# Patient Record
Sex: Male | Born: 1960 | Race: Asian | Hispanic: No | Marital: Married | Smoking: Never smoker
Health system: Southern US, Community
[De-identification: ages and names within clinical notes are randomized; demographics above are authoritative.]

## PROBLEM LIST (undated history)

## (undated) DIAGNOSIS — R51 Headache: Secondary | ICD-10-CM

## (undated) DIAGNOSIS — G43909 Migraine, unspecified, not intractable, without status migrainosus: Secondary | ICD-10-CM

## (undated) DIAGNOSIS — F329 Major depressive disorder, single episode, unspecified: Secondary | ICD-10-CM

## (undated) DIAGNOSIS — Z9889 Other specified postprocedural states: Secondary | ICD-10-CM

## (undated) DIAGNOSIS — D171 Benign lipomatous neoplasm of skin and subcutaneous tissue of trunk: Secondary | ICD-10-CM

## (undated) DIAGNOSIS — K219 Gastro-esophageal reflux disease without esophagitis: Secondary | ICD-10-CM

## (undated) DIAGNOSIS — F419 Anxiety disorder, unspecified: Secondary | ICD-10-CM

## (undated) DIAGNOSIS — F40240 Claustrophobia: Secondary | ICD-10-CM

## (undated) DIAGNOSIS — R112 Nausea with vomiting, unspecified: Secondary | ICD-10-CM

## (undated) DIAGNOSIS — F32A Depression, unspecified: Secondary | ICD-10-CM

## (undated) HISTORY — PX: SHOULDER SURGERY: SHX246

## (undated) HISTORY — DX: Benign lipomatous neoplasm of skin and subcutaneous tissue of trunk: D17.1

## (undated) HISTORY — DX: Major depressive disorder, single episode, unspecified: F32.9

## (undated) HISTORY — DX: Depression, unspecified: F32.A

## (undated) HISTORY — DX: Migraine, unspecified, not intractable, without status migrainosus: G43.909

## (undated) HISTORY — PX: OTHER SURGICAL HISTORY: SHX169

## (undated) HISTORY — DX: Anxiety disorder, unspecified: F41.9

## (undated) HISTORY — DX: Claustrophobia: F40.240

---

## 1998-04-29 ENCOUNTER — Inpatient Hospital Stay (HOSPITAL_COMMUNITY): Admission: RE | Admit: 1998-04-29 | Discharge: 1998-05-02 | Payer: Self-pay | Admitting: *Deleted

## 2006-08-29 ENCOUNTER — Emergency Department (HOSPITAL_COMMUNITY): Admission: EM | Admit: 2006-08-29 | Discharge: 2006-08-30 | Payer: Self-pay | Admitting: Emergency Medicine

## 2008-01-11 ENCOUNTER — Other Ambulatory Visit (HOSPITAL_COMMUNITY): Admission: RE | Admit: 2008-01-11 | Discharge: 2008-04-10 | Payer: Self-pay | Admitting: Psychiatry

## 2010-03-07 ENCOUNTER — Encounter: Admission: RE | Admit: 2010-03-07 | Discharge: 2010-03-07 | Payer: Self-pay | Admitting: Orthopaedic Surgery

## 2010-04-17 ENCOUNTER — Ambulatory Visit (HOSPITAL_COMMUNITY): Admission: RE | Admit: 2010-04-17 | Discharge: 2010-04-17 | Payer: Self-pay | Admitting: Orthopaedic Surgery

## 2010-04-18 ENCOUNTER — Inpatient Hospital Stay (HOSPITAL_COMMUNITY): Admission: EM | Admit: 2010-04-18 | Discharge: 2010-04-19 | Payer: Self-pay | Admitting: Specialist

## 2011-01-25 LAB — CBC
Hemoglobin: 14.6 g/dL (ref 13.0–17.0)
Platelets: 188 10*3/uL (ref 150–400)
RBC: 4.91 MIL/uL (ref 4.22–5.81)
RDW: 12.4 % (ref 11.5–15.5)
WBC: 5.6 10*3/uL (ref 4.0–10.5)

## 2013-01-30 ENCOUNTER — Ambulatory Visit (INDEPENDENT_AMBULATORY_CARE_PROVIDER_SITE_OTHER): Payer: Federal, State, Local not specified - PPO | Admitting: Surgery

## 2013-01-30 ENCOUNTER — Encounter (INDEPENDENT_AMBULATORY_CARE_PROVIDER_SITE_OTHER): Payer: Self-pay

## 2013-01-30 ENCOUNTER — Encounter (INDEPENDENT_AMBULATORY_CARE_PROVIDER_SITE_OTHER): Payer: Self-pay | Admitting: Surgery

## 2013-01-30 VITALS — BP 120/78 | HR 110 | Temp 98.0°F | Ht 69.0 in | Wt 206.8 lb

## 2013-01-30 DIAGNOSIS — D1739 Benign lipomatous neoplasm of skin and subcutaneous tissue of other sites: Secondary | ICD-10-CM

## 2013-01-30 DIAGNOSIS — D171 Benign lipomatous neoplasm of skin and subcutaneous tissue of trunk: Secondary | ICD-10-CM

## 2013-01-30 DIAGNOSIS — F40240 Claustrophobia: Secondary | ICD-10-CM | POA: Insufficient documentation

## 2013-01-30 HISTORY — DX: Benign lipomatous neoplasm of skin and subcutaneous tissue of trunk: D17.1

## 2013-01-30 NOTE — Progress Notes (Signed)
Patient ID: Harold Sullivan, male   DOB: 03/05/1961, 52 y.o.   MRN: [id number] NAME: Harold Sullivan DOB: 04/15/1961 MRN: [id number]                                                                                      DATE: 01/30/2013  PCP: ROSS,CHARLES ALAN, MD Referring Provider: No ref. provider found  IMPRESSION:  Lipoma x2 one on the right upper abdomen laterally and a mirror image 1 on the left  PLAN:   excise under local anesthesia                 CC:  Chief Complaint  Patient presents with  . New Evaluation    eval abd lipomas    HPI:  Harold Sullivan is a 52 y.o.  male who presents for evaluation of lipomas x2. In 1996 I took a lipoma from his scalp. He has 2 small subcutaneous masses he has noticed for several months. Recently they have become much more painful with some burning stabbing pain. They're almost mirror images of each other one being on the right side near his rib cage at about the anterior axillar line and along the similar location on the left. He is not having any other problems with him. He would like to have these removed because of the discomfort.  PMH:  has a past medical history of Anxiety; Depression; Claustrophobia; and Claustrophobia.  PSH:   has past surgical history that includes Shoulder surgery (Right, approx 2011) and lipoma removal on head (15 yrs ago).  ALLERGIES:   Allergies  Allergen Reactions  . Aspirin     Ringing in ears  . Codeine     Upset stomach  . Morphine And Related Nausea And Vomiting    MEDICATIONS: Current outpatient prescriptions:clonazePAM (KLONOPIN) 1 MG tablet, , Disp: , Rfl: ;  Ketorolac Tromethamine (TORADOL IJ), Inject as directed., Disp: , Rfl: ;  lidocaine (XYLOCAINE) 5 % ointment, , Disp: , Rfl: ;  mirtazapine (REMERON) 15 MG tablet, , Disp: , Rfl: ;  promethazine (PHENERGAN) 25 MG suppository, Place 25 mg rectally every 6 (six) hours as needed for nausea., Disp: , Rfl: ;  SEROQUEL XR 200 MG 24 hr tablet, , Disp: , Rfl:   SPRIX 15.75 MG/SPRAY SOLN, , Disp: , Rfl:   ROS: He has filled out our 12 point review of systems and it is negative . EXAM:   Vital signs:BP 120/78  Pulse 110  Temp(Src) 98 F (36.7 C) (Temporal)  Ht 5' 9" (1.753 m)  Wt 206 lb 12.8 oz (93.804 kg)  BMI 30.53 kg/m2  SpO2 97% General: Patient is alert oriented healthy-appearing Abdomen: Soft and benign. In the right anterior axillary line below his rib cage but in the abdomen is a 1.2 cm well-circumscribed mobile soft slightly tender mass.there is a very similar one in a similar location on the left. No other abdominal masses or findings are noted.  DATA REVIEWED:  I have reviewed the notes from the orthopedic office that referred him here.    Harold Sullivan J 01/30/2013  CC: No ref. provider found, ROSS,CHARLES ALAN, MD        

## 2013-01-30 NOTE — Patient Instructions (Signed)
We will schedule surgery to remove the small lumps on your sides

## 2013-02-05 NOTE — Progress Notes (Signed)
Pt is aware of where we are-has been here to arrive 45 min ahead-bring meds and insurance card-

## 2013-02-08 ENCOUNTER — Encounter (HOSPITAL_BASED_OUTPATIENT_CLINIC_OR_DEPARTMENT_OTHER)
Admission: RE | Disposition: A | Discharge status: Home / Self Care | Payer: Self-pay | Source: Ambulatory Visit | Attending: Surgery

## 2013-02-08 ENCOUNTER — Ambulatory Visit (HOSPITAL_BASED_OUTPATIENT_CLINIC_OR_DEPARTMENT_OTHER)
Admission: RE | Admit: 2013-02-08 | Discharge: 2013-02-08 | Disposition: A | Discharge status: Home / Self Care | Payer: Federal, State, Local not specified - PPO | Source: Ambulatory Visit | Attending: Surgery | Admitting: Surgery

## 2013-02-08 ENCOUNTER — Encounter (HOSPITAL_BASED_OUTPATIENT_CLINIC_OR_DEPARTMENT_OTHER): Payer: Self-pay | Admitting: *Deleted

## 2013-02-08 DIAGNOSIS — Z79899 Other long term (current) drug therapy: Secondary | ICD-10-CM | POA: Insufficient documentation

## 2013-02-08 DIAGNOSIS — Z885 Allergy status to narcotic agent status: Secondary | ICD-10-CM | POA: Insufficient documentation

## 2013-02-08 DIAGNOSIS — D1739 Benign lipomatous neoplasm of skin and subcutaneous tissue of other sites: Secondary | ICD-10-CM

## 2013-02-08 DIAGNOSIS — Z886 Allergy status to analgesic agent status: Secondary | ICD-10-CM | POA: Insufficient documentation

## 2013-02-08 DIAGNOSIS — D171 Benign lipomatous neoplasm of skin and subcutaneous tissue of trunk: Secondary | ICD-10-CM

## 2013-02-08 DIAGNOSIS — F411 Generalized anxiety disorder: Secondary | ICD-10-CM | POA: Insufficient documentation

## 2013-02-08 DIAGNOSIS — F329 Major depressive disorder, single episode, unspecified: Secondary | ICD-10-CM | POA: Insufficient documentation

## 2013-02-08 DIAGNOSIS — F3289 Other specified depressive episodes: Secondary | ICD-10-CM | POA: Insufficient documentation

## 2013-02-08 DIAGNOSIS — F40298 Other specified phobia: Secondary | ICD-10-CM | POA: Insufficient documentation

## 2013-02-08 HISTORY — PX: MASS EXCISION: SHX2000

## 2013-02-08 SURGERY — MINOR EXCISION OF MASS
Anesthesia: LOCAL | Site: Abdomen | Laterality: Bilateral | Wound class: Clean

## 2013-02-08 MED ORDER — CHLORHEXIDINE GLUCONATE 4 % EX LIQD: 1.0000 "application " | Freq: Once | CUTANEOUS | Status: DC

## 2013-02-08 MED ORDER — LORAZEPAM 1 MG PO TABS
1.0000 mg | ORAL_TABLET | Freq: Once | ORAL | Status: AC
  Administered 2013-02-08: 1 mg via ORAL

## 2013-02-08 MED ORDER — LIDOCAINE-EPINEPHRINE (PF) 1 %-1:200000 IJ SOLN
INTRAMUSCULAR | Status: DC | PRN
  Administered 2013-02-08: 09:00:00 via INTRAMUSCULAR

## 2013-02-08 SURGICAL SUPPLY — 37 items
ADH SKN CLS APL DERMABOND .7 (GAUZE/BANDAGES/DRESSINGS) ×4
BANDAGE ELASTIC 4 VELCRO ST LF (GAUZE/BANDAGES/DRESSINGS) IMPLANT
BLADE HEX COATED 2.75 (ELECTRODE) ×1 IMPLANT
BLADE SURG 15 STRL LF DISP TIS (BLADE) ×2 IMPLANT
BLADE SURG 15 STRL SS (BLADE) ×3
CANISTER SUCTION 1200CC (MISCELLANEOUS) IMPLANT
CHLORAPREP W/TINT 26ML (MISCELLANEOUS) ×3 IMPLANT
COVER MAYO STAND STRL (DRAPES) ×1 IMPLANT
COVER TABLE BACK 60X90 (DRAPES) IMPLANT
DECANTER SPIKE VIAL GLASS SM (MISCELLANEOUS) IMPLANT
DERMABOND ADVANCED (GAUZE/BANDAGES/DRESSINGS) ×2
DERMABOND ADVANCED .7 DNX12 (GAUZE/BANDAGES/DRESSINGS) ×4 IMPLANT
DRAPE PED LAPAROTOMY (DRAPES) IMPLANT
DRAPE UTILITY XL STRL (DRAPES) IMPLANT
DRSG TEGADERM 4X4.75 (GAUZE/BANDAGES/DRESSINGS) IMPLANT
ELECT REM PT RETURN 9FT ADLT (ELECTROSURGICAL)
ELECTRODE REM PT RTRN 9FT ADLT (ELECTROSURGICAL) ×1 IMPLANT
GAUZE SPONGE 4X4 12PLY STRL LF (GAUZE/BANDAGES/DRESSINGS) IMPLANT
GAUZE SPONGE 4X4 16PLY XRAY LF (GAUZE/BANDAGES/DRESSINGS) IMPLANT
GLOVE ECLIPSE 6.5 STRL STRAW (GLOVE) ×2 IMPLANT
GLOVE EUDERMIC 7 POWDERFREE (GLOVE) ×3 IMPLANT
GLOVE INDICATOR 6.5 STRL GRN (GLOVE) IMPLANT
GOWN PREVENTION PLUS XLARGE (GOWN DISPOSABLE) ×2 IMPLANT
NEEDLE HYPO 25X1 1.5 SAFETY (NEEDLE) IMPLANT
NS IRRIG 1000ML POUR BTL (IV SOLUTION) ×1 IMPLANT
PACK BASIN DAY SURGERY FS (CUSTOM PROCEDURE TRAY) IMPLANT
PEN SKIN MARKING BROAD TIP (MISCELLANEOUS) ×3 IMPLANT
PENCIL BUTTON HOLSTER BLD 10FT (ELECTRODE) ×1 IMPLANT
SPONGE LAP 4X18 X RAY DECT (DISPOSABLE) IMPLANT
STAPLER VISISTAT 35W (STAPLE) IMPLANT
SUT MNCRL AB 4-0 PS2 18 (SUTURE) ×3 IMPLANT
SUT VICRYL 3-0 CR8 SH (SUTURE) ×3 IMPLANT
SYR CONTROL 10ML LL (SYRINGE) IMPLANT
TOWEL OR 17X24 6PK STRL BLUE (TOWEL DISPOSABLE) ×6 IMPLANT
TOWEL OR NON WOVEN STRL DISP B (DISPOSABLE) ×3 IMPLANT
TUBE CONNECTING 20X1/4 (TUBING) IMPLANT
YANKAUER SUCT BULB TIP NO VENT (SUCTIONS) IMPLANT

## 2013-02-08 NOTE — Interval H&P Note (Signed)
History and Physical Interval Note:  02/08/2013 8:42 AM  Harold Sullivan  has presented today for surgery, with the diagnosis of lipoma of abdomen right and left  The various methods of treatment have been discussed with the patient and family. After consideration of risks, benefits and other options for treatment, the patient has consented to  Procedure(s): EXCISION MASS right left side of abdomen (N/A) as a surgical intervention .  The patient's history has been reviewed, patient examined, no change in status, stable for surgery.  I have reviewed the patient's chart and labs.  Questions were answered to the patient's satisfaction.    The areas were marked by the patient and me. He requested some sedation so will give him some PO  Ellenore Roscoe J

## 2013-02-08 NOTE — H&P (View-Only) (Signed)
Patient ID: Harold Sullivan, male   DOB: 1961/05/30, 52 y.o.   MRN: [id number] Harold Sullivan DOB: 08/24/1961 MRN: [id number]                                                                                      DATE: 01/30/2013  PCP: Harold Floro, MD Referring Provider: No ref. provider found  IMPRESSION:  Lipoma x2 one on the right upper abdomen laterally and a mirror image 1 on the left  PLAN:   excise under local anesthesia                 CC:  Chief Complaint  Patient presents with  . New Evaluation    eval abd lipomas    HPI:  Harold Sullivan is a 52 y.o.  male who presents for evaluation of lipomas x2. In 1996 I took a lipoma from his scalp. He has 2 small subcutaneous masses he has noticed for several months. Recently they have become much more painful with some burning stabbing pain. They're almost mirror images of each other one being on the right side near his rib cage at about the anterior axillar line and along the similar location on the left. He is not having any other problems with him. He would like to have these removed because of the discomfort.  PMH:  has a past medical history of Anxiety; Depression; Claustrophobia; and Claustrophobia.  PSH:   has past surgical history that includes Shoulder surgery (Right, approx 2011) and lipoma removal on head (15 yrs ago).  ALLERGIES:   Allergies  Allergen Reactions  . Aspirin     Ringing in ears  . Codeine     Upset stomach  . Morphine And Related Nausea And Vomiting    MEDICATIONS: Current outpatient prescriptions:clonazePAM (KLONOPIN) 1 MG tablet, , Disp: , Rfl: ;  Ketorolac Tromethamine (TORADOL IJ), Inject as directed., Disp: , Rfl: ;  lidocaine (XYLOCAINE) 5 % ointment, , Disp: , Rfl: ;  mirtazapine (REMERON) 15 MG tablet, , Disp: , Rfl: ;  promethazine (PHENERGAN) 25 MG suppository, Place 25 mg rectally every 6 (six) hours as needed for nausea., Disp: , Rfl: ;  SEROQUEL XR 200 MG 24 hr tablet, , Disp: , Rfl:   SPRIX 15.75 MG/SPRAY SOLN, , Disp: , Rfl:   ROS: He has filled out our 12 point review of systems and it is negative . EXAM:   Vital signs:BP 120/78  Pulse 110  Temp(Src) 98 F (36.7 C) (Temporal)  Ht 5\' 9"  (1.753 m)  Wt 206 lb 12.8 oz (93.804 kg)  BMI 30.53 kg/m2  SpO2 97% General: Patient is alert oriented healthy-appearing Abdomen: Soft and benign. In the right anterior axillary line below his rib cage but in the abdomen is a 1.2 cm well-circumscribed mobile soft slightly tender mass.there is a very similar one in a similar location on the left. No other abdominal masses or findings are noted.  DATA REVIEWED:  I have reviewed the notes from the orthopedic office that referred him here.    Allyn Bertoni J 01/30/2013  CC: No ref. provider found, Harold Floro, MD

## 2013-02-08 NOTE — Op Note (Addendum)
[facility] [birth year] [id number] 01/30/2013  Preoperative diagnosis: Bilateral flank lipomas  Postoperative diagnosis: Same  Procedure: Excision bilateral flank lipomas (2.5 cm incision each side) with layered closure  Surgeon: Currie Paris, MD, FACS  Anesthesia: Local, 1%xylo w Epi and 0.5% marcaine mixed equally, 10 cc total   Clinical History and Indications: He presents with bilateral small, symptomatic sub q masses c/w lipomas    Description of Procedure: The patient was seen in the pre-op area and the two areaas marked. He was taken to the OR and the time out done. Each area was anestetised with local, and 10 minutes later the left side was prepped and draped. A 2.5 cm incisino was made and the lipoma identified and removed ntact and in toto. The incision was closed with 3-0 Vicryl, 4-0 Monocryl and dermabond. The right side was done in the same fashion.  The patient tolerated the procedure well, there were no complications.  Currie Paris, MD, FACS 02/08/2013 9:45 AM

## 2013-02-09 ENCOUNTER — Encounter (HOSPITAL_BASED_OUTPATIENT_CLINIC_OR_DEPARTMENT_OTHER): Payer: Self-pay | Admitting: Surgery

## 2013-02-12 ENCOUNTER — Telehealth (INDEPENDENT_AMBULATORY_CARE_PROVIDER_SITE_OTHER): Payer: Self-pay | Admitting: General Surgery

## 2013-02-12 NOTE — Telephone Encounter (Signed)
Patient made aware of path results- lipoma. Will follow up at appt and call with any questions prior.

## 2013-02-12 NOTE — Telephone Encounter (Signed)
Message copied by Liliana Cline on Mon Feb 12, 2013  9:26 AM ------      Message from: Currie Paris      Created: Mon Feb 12, 2013  8:05 AM       Tell her path is benign and as expected ------

## 2013-02-27 ENCOUNTER — Encounter (INDEPENDENT_AMBULATORY_CARE_PROVIDER_SITE_OTHER): Payer: Federal, State, Local not specified - PPO | Admitting: Surgery

## 2013-03-02 ENCOUNTER — Encounter (INDEPENDENT_AMBULATORY_CARE_PROVIDER_SITE_OTHER): Payer: Self-pay | Admitting: Surgery

## 2013-03-02 ENCOUNTER — Ambulatory Visit (INDEPENDENT_AMBULATORY_CARE_PROVIDER_SITE_OTHER): Payer: Federal, State, Local not specified - PPO | Admitting: Surgery

## 2013-03-02 VITALS — BP 148/96 | HR 88 | Temp 98.3°F | Resp 20 | Ht 69.0 in | Wt 205.2 lb

## 2013-03-02 DIAGNOSIS — Z09 Encounter for follow-up examination after completed treatment for conditions other than malignant neoplasm: Secondary | ICD-10-CM

## 2013-03-02 NOTE — Progress Notes (Signed)
NAME: Harold Sullivan                                            DOB: February 06, 1961 DATE: 03/02/2013                                                  MRN: [id number]  CC:  Chief Complaint  Patient presents with  . Routine Post Op    1st po abd mass removal sx 02/08/13    HPI: This patient comes in for post op follow-up .Heunderwent removal of right and left flank area lipomas on 02/08/13. He feels that he is doing well.  PE:  VITAL SIGNS: BP 148/96  Pulse 88  Temp(Src) 98.3 F (36.8 C) (Temporal)  Resp 20  Ht 5\' 9"  (1.753 m)  Wt 205 lb 3.2 oz (93.078 kg)  BMI 30.29 kg/m2  General: The patient appears to be healthy, NAD Incisions healing nicely, no infection or problem  DATA REVIEWED: Path noted > Lipoma X2  IMPRESSION: The patient is doing well S/P Lipoma removal.    PLAN: RTC PRN. Gave patient a copy of path and discussed with him

## 2013-03-02 NOTE — Patient Instructions (Signed)
We will see you again on an as needed basis. Please call the office at [phone] if you have any questions or concerns. Thank you for allowing us to take care of you.  

## 2013-12-12 ENCOUNTER — Encounter (INDEPENDENT_AMBULATORY_CARE_PROVIDER_SITE_OTHER): Payer: Self-pay | Admitting: Surgery

## 2013-12-18 ENCOUNTER — Ambulatory Visit (INDEPENDENT_AMBULATORY_CARE_PROVIDER_SITE_OTHER): Payer: Federal, State, Local not specified - PPO | Admitting: Surgery

## 2013-12-25 ENCOUNTER — Ambulatory Visit (INDEPENDENT_AMBULATORY_CARE_PROVIDER_SITE_OTHER): Payer: Federal, State, Local not specified - PPO | Admitting: Surgery

## 2014-01-11 ENCOUNTER — Ambulatory Visit (INDEPENDENT_AMBULATORY_CARE_PROVIDER_SITE_OTHER): Payer: Federal, State, Local not specified - PPO | Admitting: Surgery

## 2014-01-14 ENCOUNTER — Ambulatory Visit (INDEPENDENT_AMBULATORY_CARE_PROVIDER_SITE_OTHER): Payer: Federal, State, Local not specified - PPO | Admitting: Surgery

## 2014-01-18 ENCOUNTER — Ambulatory Visit (INDEPENDENT_AMBULATORY_CARE_PROVIDER_SITE_OTHER): Payer: Federal, State, Local not specified - PPO | Admitting: Surgery

## 2014-01-18 ENCOUNTER — Encounter (INDEPENDENT_AMBULATORY_CARE_PROVIDER_SITE_OTHER): Payer: Self-pay | Admitting: Surgery

## 2014-01-18 VITALS — BP 142/86 | HR 88 | Temp 98.1°F | Resp 18 | Ht 72.0 in | Wt 214.4 lb

## 2014-01-18 DIAGNOSIS — D1739 Benign lipomatous neoplasm of skin and subcutaneous tissue of other sites: Secondary | ICD-10-CM

## 2014-01-18 DIAGNOSIS — D1720 Benign lipomatous neoplasm of skin and subcutaneous tissue of unspecified limb: Secondary | ICD-10-CM

## 2014-01-18 DIAGNOSIS — D171 Benign lipomatous neoplasm of skin and subcutaneous tissue of trunk: Secondary | ICD-10-CM

## 2014-01-18 DIAGNOSIS — D170 Benign lipomatous neoplasm of skin and subcutaneous tissue of head, face and neck: Secondary | ICD-10-CM | POA: Insufficient documentation

## 2014-01-18 DIAGNOSIS — D1779 Benign lipomatous neoplasm of other sites: Secondary | ICD-10-CM

## 2014-01-18 NOTE — Progress Notes (Signed)
Patient ID: Harold Sullivan, male   DOB: 09-Jul-1961, 53 y.o.   MRN: [id number]  Chief Complaint  Patient presents with  . Lipoma    abd pain from lipoma    HPI Harold Sullivan is a 53 y.o. male.   HPI This is a 53 yo male with a history of multiple lipoma excisions who presents with a very painful subcutaneous mass in his right abdominal wall.  The patient states that when he eats a large meal and his abdominal wall stretches, he gets severe pain at this site in his right upper quadrant. There is a palpable subcutaneous mass in this area. He denies any nausea or vomiting. He states that when he eats smaller meals, he does not really have pain.  He also has noted a tender mass in his upper medial left arm. This is less tender than the one on his abdominal wall but is still causing some discomfort. Previously, he had a lipoma excised from his posterior scalp. This was about 12 years ago. He has had a recurrence and has a 3 cm subcutaneous mass that is beginning to protrude. The patient gets frequent migraine headaches and is requesting excision of the scalp lipoma as well to prevent tightness of his scalp. Past Medical History  Diagnosis Date  . Anxiety   . Depression   . Claustrophobia   . Claustrophobia   . Lipoma of abdominal wall 01/30/2013  migraine headaches  Past Surgical History  Procedure Laterality Date  . Shoulder surgery Right approx 2011  . Lipoma removal on head  15 yrs ago  . Mass excision Bilateral 02/08/2013    Procedure: Minor Excision Mass Right and Left Side of Abdomen;  Surgeon: Haywood Lasso, MD;  Location: [place];  Service: General;  Laterality: Bilateral;    History reviewed. No pertinent family history.  Social History History  Substance Use Topics  . Smoking status: Never Smoker   . Smokeless tobacco: Not on file  . Alcohol Use: No    Allergies  Allergen Reactions  . Aspirin     Ringing in ears  . Codeine     Upset stomach  . Morphine  And Related Nausea And Vomiting    Current Outpatient Prescriptions  Medication Sig Dispense Refill  . ARIPiprazole (ABILIFY) 20 MG tablet Take 20 mg by mouth daily.      . B-D 3CC LUER-LOK SYR 25GX1/2" 25G X 1-1/2" 3 ML MISC       . CIALIS 5 MG tablet as needed.      . clonazePAM (KLONOPIN) 1 MG tablet       . cloNIDine (CATAPRES) 0.1 MG tablet daily.      . fenofibrate 160 MG tablet daily.      Marland Kitchen Ketorolac Tromethamine (TORADOL IJ) Inject as directed.      . lidocaine (XYLOCAINE) 5 % ointment       . mirtazapine (REMERON) 15 MG tablet       . promethazine (PHENERGAN) 25 MG suppository Place 25 mg rectally every 6 (six) hours as needed for nausea.      . SEROQUEL XR 200 MG 24 hr tablet       . SPRIX 15.75 MG/SPRAY SOLN        No current facility-administered medications for this visit.    Review of Systems Review of Systems  Constitutional: Negative for fever, chills and unexpected weight change.  HENT: Negative for congestion, hearing loss, sore throat, trouble swallowing and voice change.  Eyes: Negative for visual disturbance.  Respiratory: Negative for cough and wheezing.   Cardiovascular: Negative for chest pain, palpitations and leg swelling.  Gastrointestinal: Negative for nausea, vomiting, abdominal pain, diarrhea, constipation, blood in stool, abdominal distention, anal bleeding and rectal pain.  Genitourinary: Negative for hematuria and difficulty urinating.  Musculoskeletal: Negative for arthralgias.  Skin: Negative for rash and wound.  Neurological: Negative for seizures, syncope, weakness and headaches.  Hematological: Negative for adenopathy. Does not bruise/bleed easily.  Psychiatric/Behavioral: Positive for agitation. Negative for confusion. The patient is nervous/anxious.     Blood pressure 142/86, pulse 88, temperature 98.1 F (36.7 C), temperature source Oral, resp. rate 18, height 6' (1.829 m), weight 214 lb 6.4 oz (97.251 kg).  Physical Exam Physical  Exam WDWN in NAD Scalp - posterior midline - healed scar with underlying 3 cm subcutaneous mass Right upper abdominal wall below costal margin in mid-clavicular line - 1 cm subcutaneous mass; tender to palpation; no overlying skin changes Left upper medial arm - 1 cm subcutaneous mass; mildly tender to palpation; no overlying skin changes  Data Reviewed none  Assessment    Subcutaneous masses - right upper abdominal wall (1cm), left upper medial arm (1 cm), 3 cm subcutaneous mass - posterior scalp.  The right upper abdominal wall mass is very tender when his abdomen distends with eating.    The patient is very anxious, which may be contributing to the degree of symptoms that he is experiencing with such small masses that are probable lipomas    Plan    Excision of subcutaneous masses from right upper abdominal wall, left upper medial arm, and posterior scalp under anesthesia.  The patient is far too anxious to even consider doing this under local anesthetic or even with sedation.  He will need to begin in supine position for the abdominal wall and the upper arm masses, then flip to prone position for the scalp.  The surgical procedure has been discussed with the patient.  Potential risks, benefits, alternative treatments, and expected outcomes have been explained.  All of the patient's questions at this time have been answered.  The likelihood of reaching the patient's treatment goal is good.  The patient understand the proposed surgical procedure and wishes to proceed.        [name] K. 01/18/2014, 12:09 PM

## 2014-01-21 ENCOUNTER — Telehealth (INDEPENDENT_AMBULATORY_CARE_PROVIDER_SITE_OTHER): Payer: Self-pay | Admitting: *Deleted

## 2014-01-21 NOTE — Telephone Encounter (Signed)
Called patient back this morning and I told him that I could let a scheduler look at Dr Georgette Dover schedule at the hospital and see if he has any openings, and I transfer him to one of the scheduler and I told him if they any be on phone or with a patient to leave his name and phone number and when he his surgery is schedule on that they can see if they can move him up for an earlier

## 2014-01-21 NOTE — Telephone Encounter (Signed)
Pt called stating he is having increased pain.  Requesting to see if his sx can be moved up ASAP!  Please advise.. #[phone].  Anderson Malta

## 2014-02-14 ENCOUNTER — Encounter (HOSPITAL_COMMUNITY): Payer: Self-pay | Admitting: Pharmacy Technician

## 2014-02-19 ENCOUNTER — Encounter (HOSPITAL_COMMUNITY)
Admission: RE | Admit: 2014-02-19 | Discharge: 2014-02-19 | Disposition: A | Discharge status: Home / Self Care | Payer: Federal, State, Local not specified - PPO | Source: Ambulatory Visit | Attending: Surgery | Admitting: Surgery

## 2014-02-19 ENCOUNTER — Encounter (HOSPITAL_COMMUNITY): Payer: Self-pay

## 2014-02-19 DIAGNOSIS — Z01812 Encounter for preprocedural laboratory examination: Secondary | ICD-10-CM | POA: Insufficient documentation

## 2014-02-19 HISTORY — DX: Other specified postprocedural states: Z98.890

## 2014-02-19 HISTORY — DX: Headache: R51

## 2014-02-19 HISTORY — DX: Nausea with vomiting, unspecified: R11.2

## 2014-02-19 HISTORY — DX: Gastro-esophageal reflux disease without esophagitis: K21.9

## 2014-02-19 LAB — BASIC METABOLIC PANEL
BUN: 18 mg/dL (ref 6–23)
CO2: 25 mEq/L (ref 19–32)
Calcium: 9.8 mg/dL (ref 8.4–10.5)
Chloride: 100 mEq/L (ref 96–112)
Creatinine, Ser: 1 mg/dL (ref 0.50–1.35)
GFR calc Af Amer: >90 mL/min (ref >90)
GFR calc non Af Amer: 85 mL/min — ABNORMAL LOW (ref >90)
Glucose, Bld: 121 mg/dL — ABNORMAL HIGH (ref 70–99)
POTASSIUM: 4.1 meq/L (ref 3.7–5.3)
SODIUM: 139 meq/L (ref 137–147)

## 2014-02-19 LAB — CBC
HCT: 38.4 % — ABNORMAL LOW (ref 39.0–52.0)
Hemoglobin: 13.3 g/dL (ref 13.0–17.0)
MCH: 28.5 pg (ref 26.0–34.0)
MCHC: 34.6 g/dL (ref 30.0–36.0)
MCV: 82.2 fL (ref 78.0–100.0)
PLATELETS: 185 10*3/uL (ref 150–400)
RBC: 4.67 MIL/uL (ref 4.22–5.81)
RDW: 12.7 % (ref 11.5–15.5)
WBC: 5.8 10*3/uL (ref 4.0–10.5)

## 2014-02-19 NOTE — Pre-Procedure Instructions (Signed)
Devontae Eidem  02/19/2014   Your procedure is scheduled on:  April 21 at [place] to [place]  2 * 3 at [phone].  Call this number if you have problems the morning of surgery: [phone]   Remember:   Do not eat food or drink liquids after midnight.   Take these medicines the morning of surgery with A SIP OF WATER: Clonazepam (Klonopin) if needed, clonidine (Catapres), Phenergan (Promethazine) if needed  Stop taking aspirin, ibuprofen BC's, Goody's, Herbal medications, Fish Oil, and Aleve   Do not wear jewelry, make-up or nail polish.  Do not wear lotions, powders, or perfumes. You may wear deodorant.  Do not shave 48 hours prior to surgery. Men may shave face and neck.  Do not bring valuables to the hospital.  [facility] is not responsible for any belongings or valuables.               Contacts, dentures or bridgework may not be worn into surgery.  Leave suitcase in the car. After surgery it may be brought to your room.  For patients admitted to the hospital, discharge time is determined by your treatment team.               Patients discharged the day of surgery will not be allowed to drive home.    Special Instructions:[facility] - Preparing for Surgery  Before surgery, you can play an important role.  Because skin is not sterile, your skin needs to be as free of germs as possible.  You can reduce the number of germs on you skin by washing with CHG (chlorahexidine gluconate) soap before surgery.  CHG is an antiseptic cleaner which kills germs and bonds with the skin to continue killing germs even after washing.  Please DO NOT use if you have an allergy to CHG or antibacterial soaps.  If your skin becomes reddened/irritated stop using the CHG and inform your nurse when you arrive at Short Stay.  Do not shave (including legs and underarms) for at least 48 hours prior to the first CHG shower.  You may shave your face.  Please follow these instructions  carefully:   1.  Shower with CHG Soap the night before surgery and the   morning of Surgery.  2.  If you choose to wash your hair, wash your hair first as usual with your normal shampoo.  3.  After you shampoo, rinse your hair and body thoroughly to remove the Shampoo.  4.  Use CHG as you would any other liquid soap.  You can apply chg directly   to the skin and wash gently with scrungie or a clean washcloth.  5.  Apply the CHG Soap to your body ONLY FROM THE NECK DOWN.  Do not use on open wounds or open sores.  Avoid contact with your eyes,       ears, mouth and genitals (private parts).  Wash genitals (private parts) with your normal soap.  6.  Wash thoroughly, paying special attention to the area where your surgery  will be performed.  7.  Thoroughly rinse your body with warm water from the neck down.  8.  DO NOT shower/wash with your normal soap after using and rinsing off  the CHG Soap.  9.  Pat yourself dry with a clean towel.            10.  Wear clean pajamas.  11.  Place clean sheets on your bed the night of your first shower and do not sleep with pets.  Day of Surgery  Do not apply any lotions/deoderants the morning of surgery.  Please wear clean clothes to the hospital/surgery center.     Please read over the following fact sheets that you were given: Pain Booklet, Coughing and Deep Breathing and Surgical Site Infection Prevention

## 2014-02-19 NOTE — Progress Notes (Signed)
PCP is Dr Harrington Challenger  Denies having a recent CXR or EKG. Denies having an echo or card cath.  Stress test done maybe 5 years ago  Per pt.  Request sent to Dr Harrington Challenger for results.  Pt voices concerns about being put to sleep.  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  He sates that it is ok to put him to sleep, but he does not want to know, he wants to think he will be a little awake. (per Pt) He also voices concerns about migraines after surgery.  He takes Toradol on average 2 times a week for migraines. States the limit is 4 injections of Toradol per week, if no relief after 4 injections he will see Dr Harrington Challenger. He states that he know he can not take Toradol right after surgery, but nothing he has tried in the past works for the migraine except Toradol and Demerol.  Ebony Hail called and informed of pt concerns.

## 2014-02-20 ENCOUNTER — Encounter (HOSPITAL_COMMUNITY): Payer: Self-pay

## 2014-02-20 NOTE — Anesthesia Preprocedure Evaluation (Addendum)
Anesthesia Evaluation  Patient identified by MRN, date of birth, ID band Patient awake    Reviewed: Allergy & Precautions, H&P , NPO status , Patient's Chart, lab work & pertinent test results  History of Anesthesia Complications (+) PONV  Airway       Dental   Pulmonary          Cardiovascular     Neuro/Psych  Headaches, Anxiety Depression    GI/Hepatic GERD-  Medicated and Controlled,  Endo/Other    Renal/GU      Musculoskeletal   Abdominal   Peds  Hematology   Anesthesia Other Findings   Reproductive/Obstetrics                          Anesthesia Physical Anesthesia Plan  ASA: I  Anesthesia Plan: General and MAC   Post-op Pain Management:    Induction: Intravenous  Airway Management Planned: Simple Face Mask and LMA  Additional Equipment:   Intra-op Plan:   Post-operative Plan: Extubation in OR  Informed Consent: I have reviewed the patients History and Physical, chart, labs and discussed the procedure including the risks, benefits and alternatives for the proposed anesthesia with the patient or authorized representative who has indicated his/her understanding and acceptance.     Plan Discussed with:   Anesthesia Plan Comments: (FYI: Patient with fear of GA. Concern about post-operative migraines.  See my note and PAT RN note. Myra Gianotti, PA-C)       Anesthesia Quick Evaluation

## 2014-02-20 NOTE — Progress Notes (Signed)
Anesthesia chart review: Patient is a 53 year old male scheduled for excision of subcutaneous masses (lipomas), right upper abdominal wall, left upper arm, and posterior scalp on 02/26/2014 by Dr. Georgette Dover.    History includes GERD, migraine headaches (treated with Toradol), depression, anxiety, OCD, claustrophobia, right shoulder arthroscopy 04/2010. PCP is Dr. Melinda Crutch.  Patient has a fear of general anesthesia. (Refer to PAT note by Beverly Gust, RN.) Dr. Georgette Dover did discuss that he felt general anesthesia would be required due to patient's significant anxiety with need for multiple areas of lipoma excision. Patient also has concerns about post-operative migraines.  He had to go to the ED for post-operative migraines in the past.  He takes Toradol "injections" at home, but historically has not been able to take it post-operatively due to bleeding risk.  Reportedly only Toradol or Demerol has helped in the past.  He told his PAT RN that he is comfortable discussing this further with his anesthesia team on the day of surgery, but wanted staff to be aware.  I did print out his last anesthesia record from 04/17/10 in case that would be of some benefit for his anesthesiologist to review.    George Hugh [facility] Short Stay [facility]/Anesthesiology Phone [phone] 02/20/2014 2:41 PM

## 2014-02-25 MED ORDER — CEFAZOLIN SODIUM-DEXTROSE 2-3 GM-% IV SOLR
2.0000 g | INTRAVENOUS | Status: AC
Start: 2014-02-26 — End: 2014-02-26
  Administered 2014-02-26: 2 g via INTRAVENOUS
  Filled 2014-02-25: qty 50

## 2014-02-26 ENCOUNTER — Encounter (HOSPITAL_COMMUNITY)
Admission: RE | Disposition: A | Discharge status: Home / Self Care | Payer: Self-pay | Source: Ambulatory Visit | Attending: Surgery

## 2014-02-26 ENCOUNTER — Encounter (HOSPITAL_COMMUNITY): Payer: Federal, State, Local not specified - PPO | Admitting: Vascular Surgery

## 2014-02-26 ENCOUNTER — Ambulatory Visit (HOSPITAL_COMMUNITY): Payer: Federal, State, Local not specified - PPO | Admitting: Certified Registered"

## 2014-02-26 ENCOUNTER — Encounter (HOSPITAL_COMMUNITY): Payer: Self-pay | Admitting: *Deleted

## 2014-02-26 ENCOUNTER — Ambulatory Visit (HOSPITAL_COMMUNITY)
Admission: RE | Admit: 2014-02-26 | Discharge: 2014-02-26 | Disposition: A | Discharge status: Home / Self Care | Payer: Federal, State, Local not specified - PPO | Source: Ambulatory Visit | Attending: Surgery | Admitting: Surgery

## 2014-02-26 DIAGNOSIS — F411 Generalized anxiety disorder: Secondary | ICD-10-CM | POA: Insufficient documentation

## 2014-02-26 DIAGNOSIS — Z79899 Other long term (current) drug therapy: Secondary | ICD-10-CM | POA: Insufficient documentation

## 2014-02-26 DIAGNOSIS — Z885 Allergy status to narcotic agent status: Secondary | ICD-10-CM | POA: Insufficient documentation

## 2014-02-26 DIAGNOSIS — F40298 Other specified phobia: Secondary | ICD-10-CM | POA: Insufficient documentation

## 2014-02-26 DIAGNOSIS — D1739 Benign lipomatous neoplasm of skin and subcutaneous tissue of other sites: Secondary | ICD-10-CM | POA: Insufficient documentation

## 2014-02-26 DIAGNOSIS — D170 Benign lipomatous neoplasm of skin and subcutaneous tissue of head, face and neck: Secondary | ICD-10-CM

## 2014-02-26 DIAGNOSIS — D1720 Benign lipomatous neoplasm of skin and subcutaneous tissue of unspecified limb: Secondary | ICD-10-CM

## 2014-02-26 DIAGNOSIS — F329 Major depressive disorder, single episode, unspecified: Secondary | ICD-10-CM | POA: Insufficient documentation

## 2014-02-26 DIAGNOSIS — G43909 Migraine, unspecified, not intractable, without status migrainosus: Secondary | ICD-10-CM | POA: Insufficient documentation

## 2014-02-26 DIAGNOSIS — F3289 Other specified depressive episodes: Secondary | ICD-10-CM | POA: Insufficient documentation

## 2014-02-26 DIAGNOSIS — Z886 Allergy status to analgesic agent status: Secondary | ICD-10-CM | POA: Insufficient documentation

## 2014-02-26 HISTORY — PX: MASS EXCISION: SHX2000

## 2014-02-26 HISTORY — PX: LIPOMA EXCISION: SHX5283

## 2014-02-26 SURGERY — EXCISION LIPOMA
Anesthesia: Monitor Anesthesia Care | Site: Scalp

## 2014-02-26 MED ORDER — FENTANYL CITRATE 0.05 MG/ML IJ SOLN
INTRAMUSCULAR | Status: AC
  Filled 2014-02-26: qty 5

## 2014-02-26 MED ORDER — LACTATED RINGERS IV SOLN
INTRAVENOUS | Status: DC | PRN
  Administered 2014-02-26 (×2): via INTRAVENOUS

## 2014-02-26 MED ORDER — ONDANSETRON HCL 4 MG/2ML IJ SOLN: 4.0000 mg | INTRAMUSCULAR | Status: DC | PRN

## 2014-02-26 MED ORDER — KETOROLAC TROMETHAMINE 30 MG/ML IJ SOLN
INTRAMUSCULAR | Status: AC
  Administered 2014-02-26: 30 mg
  Filled 2014-02-26: qty 1

## 2014-02-26 MED ORDER — 0.9 % SODIUM CHLORIDE (POUR BTL) OPTIME
TOPICAL | Status: DC | PRN
  Administered 2014-02-26: 1000 mL

## 2014-02-26 MED ORDER — HYDROMORPHONE HCL PF 1 MG/ML IJ SOLN
INTRAMUSCULAR | Status: AC
  Filled 2014-02-26: qty 1

## 2014-02-26 MED ORDER — FENTANYL CITRATE 0.05 MG/ML IJ SOLN
INTRAMUSCULAR | Status: DC | PRN
  Administered 2014-02-26: 100 ug via INTRAVENOUS
  Administered 2014-02-26 (×4): 25 ug via INTRAVENOUS

## 2014-02-26 MED ORDER — HYDROMORPHONE HCL PF 1 MG/ML IJ SOLN
0.2500 mg | INTRAMUSCULAR | Status: DC | PRN
  Administered 2014-02-26: 0.5 mg via INTRAVENOUS

## 2014-02-26 MED ORDER — OXYCODONE-ACETAMINOPHEN 5-325 MG PO TABS: 1.0000 | ORAL_TABLET | ORAL | Status: DC | PRN

## 2014-02-26 MED ORDER — ONDANSETRON HCL 4 MG/2ML IJ SOLN
INTRAMUSCULAR | Status: AC
Start: 2014-02-26 — End: 2014-02-26
  Filled 2014-02-26: qty 2

## 2014-02-26 MED ORDER — MORPHINE SULFATE 2 MG/ML IJ SOLN: 2.0000 mg | INTRAMUSCULAR | Status: DC | PRN

## 2014-02-26 MED ORDER — ONDANSETRON HCL 4 MG/2ML IJ SOLN
INTRAMUSCULAR | Status: DC | PRN
  Administered 2014-02-26: 4 mg via INTRAVENOUS

## 2014-02-26 MED ORDER — KETOROLAC TROMETHAMINE 30 MG/ML IJ SOLN: 30.0000 mg | Freq: Once | INTRAMUSCULAR | Status: DC

## 2014-02-26 MED ORDER — LACTATED RINGERS IV SOLN
INTRAVENOUS | Status: DC
  Administered 2014-02-26: 09:00:00 via INTRAVENOUS

## 2014-02-26 MED ORDER — LIDOCAINE HCL (CARDIAC) 20 MG/ML IV SOLN
INTRAVENOUS | Status: DC | PRN
  Administered 2014-02-26: 60 mg via INTRAVENOUS

## 2014-02-26 MED ORDER — MIDAZOLAM HCL 2 MG/2ML IJ SOLN
INTRAMUSCULAR | Status: AC
  Filled 2014-02-26: qty 2

## 2014-02-26 MED ORDER — OXYCODONE-ACETAMINOPHEN 5-325 MG PO TABS
ORAL_TABLET | ORAL | Status: AC
  Filled 2014-02-26: qty 1

## 2014-02-26 MED ORDER — BUPIVACAINE-EPINEPHRINE 0.25% -1:200000 IJ SOLN
INTRAMUSCULAR | Status: DC | PRN
  Administered 2014-02-26: 9 mL

## 2014-02-26 MED ORDER — PROPOFOL 10 MG/ML IV BOLUS
INTRAVENOUS | Status: AC
  Filled 2014-02-26: qty 20

## 2014-02-26 MED ORDER — PROPOFOL 10 MG/ML IV BOLUS
INTRAVENOUS | Status: DC | PRN
  Administered 2014-02-26: 200 mg via INTRAVENOUS

## 2014-02-26 MED ORDER — ONDANSETRON HCL 4 MG/2ML IJ SOLN: 4.0000 mg | Freq: Once | INTRAMUSCULAR | Status: DC | PRN

## 2014-02-26 MED ORDER — MIDAZOLAM HCL 5 MG/5ML IJ SOLN
INTRAMUSCULAR | Status: DC | PRN
  Administered 2014-02-26: 2 mg via INTRAVENOUS

## 2014-02-26 MED ORDER — OXYCODONE-ACETAMINOPHEN 5-325 MG PO TABS
1.0000 | ORAL_TABLET | ORAL | Status: DC | PRN
Start: 2014-02-26 — End: 2014-02-26

## 2014-02-26 SURGICAL SUPPLY — 60 items
ADH SKN CLS APL DERMABOND .7 (GAUZE/BANDAGES/DRESSINGS) ×3
APL SKNCLS STERI-STRIP NONHPOA (GAUZE/BANDAGES/DRESSINGS) ×6
BENZOIN TINCTURE PRP APPL 2/3 (GAUZE/BANDAGES/DRESSINGS) ×9 IMPLANT
BLADE 10 SAFETY STRL DISP (BLADE) ×2 IMPLANT
BLADE 15 SAFETY STRL DISP (BLADE) ×1 IMPLANT
BLADE SURG ROTATE 9660 (MISCELLANEOUS) ×6 IMPLANT
CHLORAPREP W/TINT 10.5 ML (MISCELLANEOUS) ×3 IMPLANT
CHLORAPREP W/TINT 26ML (MISCELLANEOUS) ×4 IMPLANT
CLOSURE WOUND 1/2 X4 (GAUZE/BANDAGES/DRESSINGS)
COVER SURGICAL LIGHT HANDLE (MISCELLANEOUS) ×8 IMPLANT
DECANTER SPIKE VIAL GLASS SM (MISCELLANEOUS) ×1 IMPLANT
DERMABOND ADVANCED (GAUZE/BANDAGES/DRESSINGS) ×2
DERMABOND ADVANCED .7 DNX12 (GAUZE/BANDAGES/DRESSINGS) ×1 IMPLANT
DRAPE EENT NEONATAL 1202 (DRAPE) ×9 IMPLANT
DRAPE PED LAPAROTOMY (DRAPES) ×1 IMPLANT
DRAPE UTILITY 15X26 W/TAPE STR (DRAPE) ×2 IMPLANT
DRSG TEGADERM 2-3/8X2-3/4 SM (GAUZE/BANDAGES/DRESSINGS) ×8 IMPLANT
DRSG TEGADERM 4X4.75 (GAUZE/BANDAGES/DRESSINGS) ×1 IMPLANT
ELECT CAUTERY BLADE 6.4 (BLADE) ×5 IMPLANT
ELECT REM PT RETURN 9FT ADLT (ELECTROSURGICAL) ×5
ELECTRODE REM PT RTRN 9FT ADLT (ELECTROSURGICAL) ×3 IMPLANT
GAUZE SPONGE 2X2 8PLY STRL LF (GAUZE/BANDAGES/DRESSINGS) ×2 IMPLANT
GLASS VAN CARTRIDGE-BLADE MANAGEMENT SYSTEM - BLADE NO. 10 ×4 IMPLANT
GLOVE BIO SURGEON STRL SZ7 (GLOVE) ×9 IMPLANT
GLOVE BIOGEL PI IND STRL 6.5 (GLOVE) ×1 IMPLANT
GLOVE BIOGEL PI IND STRL 7.0 (GLOVE) ×2 IMPLANT
GLOVE BIOGEL PI IND STRL 7.5 (GLOVE) ×4 IMPLANT
GLOVE BIOGEL PI INDICATOR 6.5 (GLOVE) ×2
GLOVE BIOGEL PI INDICATOR 7.0 (GLOVE) ×2
GLOVE BIOGEL PI INDICATOR 7.5 (GLOVE) ×4
GOWN STRL REUS W/ TWL LRG LVL3 (GOWN DISPOSABLE) ×8 IMPLANT
GOWN STRL REUS W/TWL LRG LVL3 (GOWN DISPOSABLE) ×15
KIT BASIN OR (CUSTOM PROCEDURE TRAY) ×5 IMPLANT
KIT ROOM TURNOVER OR (KITS) ×5 IMPLANT
NDL HYPO 25GX1X1/2 BEV (NEEDLE) ×1 IMPLANT
NEEDLE HYPO 25GX1X1/2 BEV (NEEDLE) ×5 IMPLANT
NS IRRIG 1000ML POUR BTL (IV SOLUTION) ×5 IMPLANT
PACK SURGICAL SETUP 50X90 (CUSTOM PROCEDURE TRAY) ×5 IMPLANT
PAD ARMBOARD 7.5X6 YLW CONV (MISCELLANEOUS) ×17 IMPLANT
PENCIL BUTTON HOLSTER BLD 10FT (ELECTRODE) ×5 IMPLANT
SOLUTION BETADINE 4OZ (MISCELLANEOUS) ×4 IMPLANT
SPECIMEN JAR SMALL (MISCELLANEOUS) ×8 IMPLANT
SPONGE GAUZE 2X2 STER 10/PKG (GAUZE/BANDAGES/DRESSINGS) ×4
SPONGE GAUZE 4X4 12PLY (GAUZE/BANDAGES/DRESSINGS) ×2 IMPLANT
SPONGE LAP 18X18 X RAY DECT (DISPOSABLE) ×1 IMPLANT
STRIP CLOSURE SKIN 1/2X4 (GAUZE/BANDAGES/DRESSINGS) ×1 IMPLANT
SUT MNCRL AB 4-0 PS2 18 (SUTURE) ×9 IMPLANT
SUT VIC AB 2-0 SH 27 (SUTURE) ×5
SUT VIC AB 2-0 SH 27X BRD (SUTURE) ×1 IMPLANT
SUT VIC AB 3-0 SH 27 (SUTURE) ×15
SUT VIC AB 3-0 SH 27X BRD (SUTURE) ×4 IMPLANT
SUT VIC AB 3-0 SH 27XBRD (SUTURE) ×3 IMPLANT
SYR BULB 3OZ (MISCELLANEOUS) ×3 IMPLANT
SYR CONTROL 10ML LL (SYRINGE) ×5 IMPLANT
TOWEL OR 17X24 6PK STRL BLUE (TOWEL DISPOSABLE) ×5 IMPLANT
TOWEL OR 17X26 10 PK STRL BLUE (TOWEL DISPOSABLE) ×5 IMPLANT
TOWEL OR NON WOVEN STRL DISP B (DISPOSABLE) ×4 IMPLANT
TUBE CONNECTING 12'X1/4 (SUCTIONS) ×2
TUBE CONNECTING 12X1/4 (SUCTIONS) ×4 IMPLANT
YANKAUER SUCT BULB TIP NO VENT (SUCTIONS) ×4 IMPLANT

## 2014-02-26 NOTE — Op Note (Signed)
Preop diagnosis: #1 tender subcutaneous masses of the right abdominal wall and the left upper arm (all of these were 1 cm or less) #2 subcutaneous mass of the posterior midline scalp (3 cm) Postop diagnosis: Same Procedure performed: Excision of subcutaneous masses of the right abdominal wall, left upper arm, and posterior midline scalp Surgeon:Charod Slawinski K. Astou Lada Anesthesia: Gen. Via LMA Indications: This is a 53 year old male who has had previous lipoma excisions who presents with a tender subcutaneous mass in the right upper quadrant of his abdominal wall. This area feels fairly small but was quite firm and tender. He also has 2 small subcutaneous masses in the medial side of his left upper arm that are becoming tender. He had a previous excision of a lipoma from the posterior midline scalp and this seems to be recurring and enlarging. He presents now for excision of all these areas.  Description of procedure: The patient brought to the operating room placed in supine position on the operating room table. After an adequate level of general anesthesia was obtained the patient's right upper quadrant of his abdomen was prepped with chlor prep and draped in sterile fashion. We were also able to prep in the mediastinum th left upper arm and draped this in sterile fashion. We began on the abdominal wall. We anesthetized the area with  0.25% Marcaine with epinephrine. A transverse incision was made. Dissection was carried down the subcutaneous tissues. We excised an area of subcutaneous tissue containing a fairly firm 1 cm mass. This was sent for pathologic examination. We inspected for hemostasis. The wound was closed with 3-0 Vicryl and 4-0 Monocryl. We then made a vertical incision on the medial side of left upper arm. Several small hard lipomas were dissected out from the subcutaneous tissues. These were all less than a centimeter in size. We closed with 4-0 Monocryl. Steri-Strips and an occlusive dressing were  placed over each of these incisions. We then positioned the patient on his right side on a beanbag. We shaved a small area of hair over his previous posterior midline scalp incision. We prepped with Betadine and draped in sterile fashion. We anesthetized with Marcaine. I opened the sole incision. We dissected down through the dermis. There is no discrete lipoma but there is definitely an area of thickening. This seems to be some scar tissue from his previous excision. We excised the scar tissue until this area was concave. We inspected for hemostasis. We closed with a deep layer of 3-0 Vicryl and a subcuticular layer of 4-0 Monocryl. Dermabond was applied. The patient was then extubated and brought to recovery in stable condition. All sponge, instrument, and needle counts are correct.  [name]. Georgette Dover, MD, [facility] Surgery  General/ Trauma Surgery  02/26/2014 10:48 AM

## 2014-02-26 NOTE — Discharge Instructions (Signed)
Abdominal wall and arm - remove dressings in 48 hours and then you may shower over the steri-strips.  They will come off in 1 week.  The sutures under the skin will dissolve on their own.  Scalp - you may shower over this area in 48 hours.  The Dermabond skin glue will begin flaking off in a few days.  You may use ice packs over each of these areas for the first couple of days.  Follow-up 3 weeks.   What to eat:  For your first meals, you should eat lightly; only small meals initially.  If you do not have nausea, you may eat larger meals.  Avoid spicy, greasy and heavy food.    General Anesthesia, Adult, Care After  Refer to this sheet in the next few weeks. These instructions provide you with information on caring for yourself after your procedure. Your health care provider may also give you more specific instructions. Your treatment has been planned according to current medical practices, but problems sometimes occur. Call your health care provider if you have any problems or questions after your procedure.  WHAT TO EXPECT AFTER THE PROCEDURE  After the procedure, it is typical to experience:  Sleepiness.  Nausea and vomiting. HOME CARE INSTRUCTIONS  For the first 24 hours after general anesthesia:  Have a responsible person with you.  Do not drive a car. If you are alone, do not take public transportation.  Do not drink alcohol.  Do not take medicine that has not been prescribed by your health care provider.  Do not sign important papers or make important decisions.  You may resume a normal diet and activities as directed by your health care provider.  Change bandages (dressings) as directed.  If you have questions or problems that seem related to general anesthesia, call the hospital and ask for the anesthetist or anesthesiologist on call. SEEK MEDICAL CARE IF:  You have nausea and vomiting that continue the day after anesthesia.  You develop a rash. SEEK IMMEDIATE MEDICAL CARE  IF:  You have difficulty breathing.  You have chest pain.  You have any allergic problems. Document Released: 01/31/2001 Document Revised: 06/27/2013 Document Reviewed: 05/10/2013  [facility] Patient Information 2014 [place], [place].

## 2014-02-26 NOTE — Anesthesia Postprocedure Evaluation (Signed)
  Anesthesia Post-op Note  Patient: Harold Sullivan  Procedure(s) Performed: Procedure(s) with comments: EXCISION OF SUBCUTAENOUS MASSES RIGHT UPPER ABDOMINAL WALL AND LEFT UPPER ARM (Bilateral) - RIGHT UPPER ABDOMINAL WALL, LEFT UPPER ARM, SCALP EXCISION OF SUBCUTANEOUS MASS OF POSTERIOR SCALP (N/A)  Patient Location: PACU  Anesthesia Type:General  Level of Consciousness: awake, alert , oriented and patient cooperative  Airway and Oxygen Therapy: Patient Spontanous Breathing  Post-op Pain: 4 /10  Post-op Assessment: Post-op Vital signs reviewed, Patient's Cardiovascular Status Stable, Respiratory Function Stable, Patent Airway, No signs of Nausea or vomiting and Pain level controlled  Post-op Vital Signs: stable  Last Vitals:  Filed Vitals:   02/26/14 1115  BP: 144/90  Pulse: 93  Temp:   Resp: 18    Complications: No apparent anesthesia complications

## 2014-02-26 NOTE — Anesthesia Procedure Notes (Signed)
Procedure Name: LMA Insertion Date/Time: 02/26/2014 9:10 AM Performed by: Maeola Harman Pre-anesthesia Checklist: Patient identified, Emergency Drugs available, Suction available, Patient being monitored and Timeout performed Patient Re-evaluated:Patient Re-evaluated prior to inductionOxygen Delivery Method: Circle system utilized Preoxygenation: Pre-oxygenation with 100% oxygen Intubation Type: IV induction Ventilation: Mask ventilation without difficulty LMA: LMA inserted LMA Size: 5.0 Number of attempts: 1 Tube secured with: Tape Dental Injury: Teeth and Oropharynx as per pre-operative assessment  Comments: Easy induction and LMA insertion.  Dr. Tamala Julian verified placement.  Waldron Session, CRNA

## 2014-02-26 NOTE — Transfer of Care (Signed)
Immediate Anesthesia Transfer of Care Note  Patient: Harold Sullivan  Procedure(s) Performed: Procedure(s) with comments: EXCISION OF SUBCUTAENOUS MASSES RIGHT UPPER ABDOMINAL WALL AND LEFT UPPER ARM (Bilateral) - RIGHT UPPER ABDOMINAL WALL, LEFT UPPER ARM, SCALP EXCISION OF SUBCUTANEOUS MASS OF POSTERIOR SCALP (N/A)  Patient Location: PACU  Anesthesia Type:General  Level of Consciousness: awake, alert  and sedated  Airway & Oxygen Therapy: Patient connected to face mask oxygen  Post-op Assessment: Report given to PACU RN  Post vital signs: stable  Complications: No apparent anesthesia complications

## 2014-02-26 NOTE — H&P (Signed)
HPI  Harold Sullivan is a 53 y.o. male.  HPI  This is a 53 yo male with a history of multiple lipoma excisions who presents with a very painful subcutaneous mass in his right abdominal wall. The patient states that when he eats a large meal and his abdominal wall stretches, he gets severe pain at this site in his right upper quadrant. There is a palpable subcutaneous mass in this area. He denies any nausea or vomiting. He states that when he eats smaller meals, he does not really have pain. He also has noted a tender mass in his upper medial left arm. This is less tender than the one on his abdominal wall but is still causing some discomfort. Previously, he had a lipoma excised from his posterior scalp. This was about 12 years ago. He has had a recurrence and has a 3 cm subcutaneous mass that is beginning to protrude. The patient gets frequent migraine headaches and is requesting excision of the scalp lipoma as well to prevent tightness of his scalp.  Past Medical History   Diagnosis  Date   .  Anxiety    .  Depression    .  Claustrophobia    .  Claustrophobia    .  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  Shoulder surgery  Right  approx 2011   .  Lipoma removal on head   15 yrs ago   .  Mass excision  Bilateral  02/08/2013     Procedure: Minor Excision Mass Right and Left Side of Abdomen; Surgeon: Haywood Lasso, MD; Location: [place]; Service: General; Laterality: Bilateral;   History reviewed. No pertinent family history.  Social History  History   Substance Use Topics   .  Smoking status:  Never Smoker   .  Smokeless tobacco:  Not on file   .  Alcohol Use:  No    Allergies   Allergen  Reactions   .  Aspirin      Ringing in ears   .  Codeine      Upset stomach   .  Morphine And Related  Nausea And Vomiting    Current Outpatient Prescriptions   Medication  Sig  Dispense  Refill   .   ARIPiprazole (ABILIFY) 20 MG tablet  Take 20 mg by mouth daily.     .  B-D 3CC LUER-LOK SYR 25GX1/2" 25G X 1-1/2" 3 ML MISC      .  CIALIS 5 MG tablet  as needed.     .  clonazePAM (KLONOPIN) 1 MG tablet      .  cloNIDine (CATAPRES) 0.1 MG tablet  daily.     .  fenofibrate 160 MG tablet  daily.     Marland Kitchen  Ketorolac Tromethamine (TORADOL IJ)  Inject as directed.     .  lidocaine (XYLOCAINE) 5 % ointment      .  mirtazapine (REMERON) 15 MG tablet      .  promethazine (PHENERGAN) 25 MG suppository  Place 25 mg rectally every 6 (six) hours as needed for nausea.     .  SEROQUEL XR 200 MG 24 hr tablet      .  SPRIX 15.75 MG/SPRAY SOLN       No current facility-administered medications for this visit.   Review of Systems  Review of Systems  Constitutional: Negative  for fever, chills and unexpected weight change.  HENT: Negative for congestion, hearing loss, sore throat, trouble swallowing and voice change.  Eyes: Negative for visual disturbance.  Respiratory: Negative for cough and wheezing.  Cardiovascular: Negative for chest pain, palpitations and leg swelling.  Gastrointestinal: Negative for nausea, vomiting, abdominal pain, diarrhea, constipation, blood in stool, abdominal distention, anal bleeding and rectal pain.  Genitourinary: Negative for hematuria and difficulty urinating.  Musculoskeletal: Negative for arthralgias.  Skin: Negative for rash and wound.  Neurological: Negative for seizures, syncope, weakness and headaches.  Hematological: Negative for adenopathy. Does not bruise/bleed easily.  Psychiatric/Behavioral: Positive for agitation. Negative for confusion. The patient is nervous/anxious.  Blood pressure 142/86, pulse 88, temperature 98.1 F (36.7 C), temperature source Oral, resp. rate 18, height 6' (1.829 m), weight 214 lb 6.4 oz (97.251 kg).  Physical Exam  Physical Exam  WDWN in NAD  Scalp - posterior midline - healed scar with underlying 3 cm subcutaneous mass  Right upper  abdominal wall below costal margin in mid-clavicular line - 1 cm subcutaneous mass; tender to palpation; no overlying skin changes  Left upper medial arm - 1 cm subcutaneous mass; mildly tender to palpation; no overlying skin changes  Data Reviewed  none  Assessment  Subcutaneous masses - right upper abdominal wall (1cm), left upper medial arm (1 cm), 3 cm subcutaneous mass - posterior scalp. The right upper abdominal wall mass is very tender when his abdomen distends with eating.  The patient is very anxious, which may be contributing to the degree of symptoms that he is experiencing with such small masses that are probable lipomas  Plan  Excision of subcutaneous masses from right upper abdominal wall, left upper medial arm, and posterior scalp under anesthesia. The patient is far too anxious to even consider doing this under local anesthetic or even with sedation. He will need to begin in supine position for the abdominal wall and the upper arm masses, then flip to prone position for the scalp.  The surgical procedure has been discussed with the patient. Potential risks, benefits, alternative treatments, and expected outcomes have been explained. All of the patient's questions at this time have been answered. The likelihood of reaching the patient's treatment goal is good. The patient understand the proposed surgical procedure and wishes to proceed.    Harold Sullivan. Georgette Dover, MD, [facility] Surgery  General/ Trauma Surgery  02/26/2014 7:25 AM

## 2014-02-27 ENCOUNTER — Telehealth (INDEPENDENT_AMBULATORY_CARE_PROVIDER_SITE_OTHER): Payer: Self-pay | Admitting: General Surgery

## 2014-02-27 NOTE — Telephone Encounter (Signed)
Called patient to let him know that his path results was. And I made 1st apt in May

## 2014-02-27 NOTE — Telephone Encounter (Signed)
Message copied by Maryclare Bean on Wed Feb 27, 2014 11:01 AM ------      Message from: Harold Sullivan      Created: Wed Feb 27, 2014 10:23 AM       Forwarded path report.      Deacon Gadbois,please let the patient know that these were all lipomas - benign, no sign of any cancer       ------

## 2014-02-28 ENCOUNTER — Encounter (HOSPITAL_COMMUNITY): Payer: Self-pay | Admitting: Surgery

## 2014-03-18 ENCOUNTER — Encounter (INDEPENDENT_AMBULATORY_CARE_PROVIDER_SITE_OTHER): Payer: Self-pay | Admitting: Surgery

## 2014-03-18 ENCOUNTER — Ambulatory Visit (INDEPENDENT_AMBULATORY_CARE_PROVIDER_SITE_OTHER): Payer: Federal, State, Local not specified - PPO | Admitting: Surgery

## 2014-03-18 VITALS — BP 112/70 | HR 82 | Temp 97.8°F | Resp 14 | Wt 213.0 lb

## 2014-03-18 DIAGNOSIS — D1739 Benign lipomatous neoplasm of skin and subcutaneous tissue of other sites: Secondary | ICD-10-CM

## 2014-03-18 DIAGNOSIS — D1720 Benign lipomatous neoplasm of skin and subcutaneous tissue of unspecified limb: Secondary | ICD-10-CM

## 2014-03-18 DIAGNOSIS — D170 Benign lipomatous neoplasm of skin and subcutaneous tissue of head, face and neck: Secondary | ICD-10-CM

## 2014-03-18 DIAGNOSIS — D171 Benign lipomatous neoplasm of skin and subcutaneous tissue of trunk: Secondary | ICD-10-CM

## 2014-03-18 DIAGNOSIS — D1779 Benign lipomatous neoplasm of other sites: Secondary | ICD-10-CM

## 2014-03-18 MED ORDER — METHOCARBAMOL 500 MG PO TABS: 500.0000 mg | ORAL_TABLET | Freq: Four times a day (QID) | ORAL | Status: AC

## 2014-03-18 NOTE — Progress Notes (Signed)
S/p excision of lipomas from the right upper quadrant abdominal wall, left upper arm, and posterior scalp.  All were just lipomas.  The abdominal wall and left arm incisions are well-healed with no signs of infection.  The posterior scalp wound had some subcutaneous hematoma that caused some slight wound separation, but there is now a dry eschar over this area.  No sign of infection.  This is making his neck sore.  I will give him a prescription for Robaxin to help with the muscle tightness in his neck.  Followup PRN.  [name]. Georgette Dover, MD, [facility] Surgery  General/ Trauma Surgery  03/18/2014 11:12 AM

## 2015-06-10 ENCOUNTER — Ambulatory Visit (INDEPENDENT_AMBULATORY_CARE_PROVIDER_SITE_OTHER): Payer: Federal, State, Local not specified - PPO | Admitting: Podiatry

## 2015-06-10 ENCOUNTER — Encounter: Payer: Self-pay | Admitting: Podiatry

## 2015-06-10 VITALS — BP 118/72 | HR 88 | Resp 12

## 2015-06-10 DIAGNOSIS — L600 Ingrowing nail: Secondary | ICD-10-CM

## 2015-06-10 NOTE — Progress Notes (Signed)
   Subjective:    Patient ID: Harold Sullivan, male    DOB: [birth year], 54 y.o.   MRN: [id number]  HPI   This patient presents today complaining of painful toenails 1-5 bilaterally. He describes persistent pain in the medial lateral borders of all these toenails and has extremely difficult time trimming the toenails. He denies any recent infection, however, reoccurring pain and discomfort from the nails when he walks wear shoes. When he first presented Harold Sullivan initially described discomfort in the hallux and third toenails, however, he was concerned about all the toenails and requested that all the nails be evaluated and treated.  Patient relates a history of migraine headaches He relates extreme intolerance to all by mouth avoid analgesia 6 and can only tolerate Toradol injections to control his headaches. He can tolerate acetaminophen. He said he cannot tolerate ibuprofen      Review of Systems  Neurological: Positive for headaches.  Psychiatric/Behavioral: Positive for confusion.       Objective:   Physical Exam  Patient orientated 3. Patient's male friend is present in treatment room  Vascular: No peripheral edema noted bilaterally DP and PT pulses 2/4 bilaterally Capillary reflex immediate bilaterally  Neurological: Ankle reflex equal and reactive bilaterally Vibratory sensation intact bilaterally  Dermatological: The right hallux nail is club-shaped and the distal and beds into the distal toe The margins of toenails 2-5 are incurvated and tender direct palpation  The margins of the toenails 1-5 left are incurvated and tender direct palpation  Musculoskeletal: No deformities noted bilaterally        Assessment & Plan:   Assessment: Satisfactory neurovascular status Deformed club-shaped right hallux nail that embeds distally into the distal right hallux Ingrowing medial lateral borders of toenails 2-5 right and 1/5 left  Plan: We discussed treatment options  including repetitive debridement(phenol matricectomy for permanent correction. I described the need to remove the entire right hallux nail and prevents the entire hallux nail from regrowing because of the deformity the nail. The remaining toenails 1-5 left and 2-5 right the medial and ateral borders could be removed for permanent correction. Patient states that he would like to have all the nails treated to relieve the ingrowing toenails. As patient is extremely intolerant of pain medications he said he would use acetaminophen for pain control and occasional  existing Toradol injections for pain control if needed  Patient is scheduled forRight foot initially: Total right hallux phenol matricectomy Excision of medial lateral borders toenails 2-5 right and phenol matriixectomies  Rescheduled patient initially convenient time

## 2015-06-10 NOTE — Patient Instructions (Signed)
Will plan to do 1 foot per operative session On the right foot remove the entire toenail and prevent from regrowing Remove both size of toenails 2-5 right  Left foot Remove both size toenails 1-5  Patient will use existing injectable Toradol for pain control plus Tylenol

## 2015-06-25 ENCOUNTER — Encounter: Payer: Self-pay | Admitting: Podiatry

## 2015-06-25 ENCOUNTER — Ambulatory Visit (INDEPENDENT_AMBULATORY_CARE_PROVIDER_SITE_OTHER): Payer: Federal, State, Local not specified - PPO | Admitting: Podiatry

## 2015-06-25 VITALS — BP 116/71 | HR 71 | Resp 14

## 2015-06-25 DIAGNOSIS — L600 Ingrowing nail: Secondary | ICD-10-CM

## 2015-06-25 NOTE — Patient Instructions (Signed)
  Okay to use acetaminophen or Tylenol for pain control Use your existing Toradol as prescribed for pain control as we discussed Apply topical antibiotic ointment to toenails 1-5 daily Okay to use a white cotton sock instead of bandaging the toes individually  ANTIBACTERIAL SOAP INSTRUCTIONS  THE DAY AFTER PROCEDURE  Please follow the instructions your doctor has marked.   Shower as usual. Before getting out, place a drop of antibacterial liquid soap (Dial) on a wet, clean washcloth.  Gently wipe washcloth over affected area.  Afterward, rinse the area with warm water.  Blot the area dry with a soft cloth and cover with antibiotic ointment (neosporin, polysporin, bacitracin) and band aid or gauze and tape  Place 3-4 drops of antibacterial liquid soap in a quart of warm tap water.  Submerge foot into water for 20 minutes.  If bandage was applied after your procedure, leave on to allow for easy lift off, then remove and continue with soak for the remaining time.  Next, blot area dry with a soft cloth and cover with a bandage.  Apply other medications as directed by your doctor, such as cortisporin otic solution (eardrops) or neosporin antibiotic ointment

## 2015-06-26 NOTE — Progress Notes (Signed)
Patient ID: Harold Sullivan, male   DOB: May 05, 1961, 54 y.o.   MRN: [id number]  Subjective: This patient presents today for scheduled visit or phenol matrixcectomies toenails 1-5 right. He has consented for total permanent removal of the right hallux toenail and the medial lateral borders of toenails 2-5 right. We reviewed the plan from the visit of 06/10/2015 and patient signed consent form. He said no preoperative medication. He understands that his pain management is going to be controlled with the existing Toradol and over-the-counter acetaminophen  Objective: Patient appears orientated 3 Vital signs stable   Right foot The right hallux is hypertrophic and deformed The medial lateral borders of toenails 2-5 are incurvated and tender  Assessment: Deformed ingrowing right hallux nail Ingrowing medial lateral borders toenails 2-5 right  Plan: Alcohol phenol matrixexectomies 5 right The toes 1-5 are blocked with a total of 8 mL of 2% plain Xylocaine and 0.5% plain Marcaine in divided doses. The toes are prepped with Betadine and exsanguinated. The right hallux nail was avulsed and a phenol matricectomy performed (3 applications of 1 minute of phenol) The medial lateral borders of toenails 2-5 were excised and phenol matrixectomies performed The wounds were is with alcohol and an antibiotic dressing was applied. The tourniquets released and there was spontaneous capillary fill time toes 1-5 right. Patient tolerated procedures without any difficulty.  Postoperative oral and written instructions provided.. Pain management reviewed as described above  Reappoint 7 days for postoperative progress. If patient is making satisfactory progress we'll tentatively schedule phenol matricectomy 1-5 left and approximately 2 weeks

## 2015-07-01 ENCOUNTER — Encounter: Payer: Self-pay | Admitting: Podiatry

## 2015-07-01 ENCOUNTER — Ambulatory Visit (INDEPENDENT_AMBULATORY_CARE_PROVIDER_SITE_OTHER): Payer: Federal, State, Local not specified - PPO | Admitting: Podiatry

## 2015-07-01 VITALS — BP 138/69 | HR 72 | Resp 12

## 2015-07-01 DIAGNOSIS — L600 Ingrowing nail: Secondary | ICD-10-CM

## 2015-07-01 NOTE — Progress Notes (Signed)
Patient ID: Harold Sullivan, male   DOB: 03/17/1961, 54 y.o.   MRN: [id number]  Subjective: This patient presents postop phenol matricectomy 5 right foot on the visit 06/26/2015. At this time he is complaining some residual sensitivity the surgical sites and is requesting rescheduling of the AP nails 5 on the right foot an additional several weeks  Objective: Patient appears orientated 3 Nails surgical sites 1-5 have crusted areas without any erythema, edema, warmth or active drainage. The medial and lateral margins 2-5 are narrowed Right hallux nail bed is crusted and is not present  Assessment: Satisfactory postoperative progress without clinical sign of infection  Plan: Maintain Dilantin bacterial soft soap if drainage is noted and continue to apply topical antibiotic ointment to nails sites daily and use a cotton sock as a dressing  Patient request rescheduling of AP nails 5 on the left additional several weeks Rescheduled times 3-4 weeks for AP nails 5 left

## 2015-07-01 NOTE — Patient Instructions (Signed)
Continue to apply topical antibiotic ointment to toenails 1-5 on the right foot Use a cotton sock as a dressing  Will reschedule AP nails 5 on the right

## 2015-07-08 ENCOUNTER — Encounter: Payer: Federal, State, Local not specified - PPO | Admitting: Podiatry

## 2015-07-16 ENCOUNTER — Encounter (HOSPITAL_BASED_OUTPATIENT_CLINIC_OR_DEPARTMENT_OTHER): Payer: Federal, State, Local not specified - PPO | Attending: Surgery

## 2015-07-16 DIAGNOSIS — L905 Scar conditions and fibrosis of skin: Secondary | ICD-10-CM | POA: Diagnosis present

## 2015-07-16 DIAGNOSIS — Z79899 Other long term (current) drug therapy: Secondary | ICD-10-CM | POA: Diagnosis not present

## 2015-07-30 ENCOUNTER — Ambulatory Visit (INDEPENDENT_AMBULATORY_CARE_PROVIDER_SITE_OTHER): Payer: Federal, State, Local not specified - PPO | Admitting: Podiatry

## 2015-07-30 VITALS — BP 156/86 | HR 69 | Resp 12

## 2015-07-30 DIAGNOSIS — L600 Ingrowing nail: Secondary | ICD-10-CM | POA: Diagnosis not present

## 2015-07-30 NOTE — Patient Instructions (Addendum)
ANTIBACTERIAL SOAP INSTRUCTIONS  THE DAY AFTER PROCEDURE  Please follow the instructions your doctor has marked.   Shower as usual. Before getting out, place a drop of antibacterial liquid soap (Dial) on a wet, clean washcloth.  Gently wipe washcloth over affected area.  Afterward, rinse the area with warm water.  Blot the area dry with a soft cloth and cover with antibiotic ointment (neosporin, polysporin, bacitracin TRIPLE ANTIBIOTIC OINMENT) and band aid or gauze and tape  Place 3-4 drops of antibacterial liquid soap in a quart of warm tap water.  Submerge foot into water for 2 minutes.  If bandage was applied after your procedure, leave on to allow for easy lift off, then remove and continue with soak for the remaining time.  Next, blot area dry with a soft cloth and cover with a bandage.  Apply other medications as directed by your doctor, such as cortisporin otic solution (eardrops) or neosporin antibiotic ointment

## 2015-07-31 NOTE — Progress Notes (Signed)
Patient ID: Harold Sullivan , male   DOB: 04-25-1961, 54 y.o.   MRN: [id number]  Subjective: This patient presents today for permanent nail surgeries on the medial lateral borders of toenails 1-5 on the left foot. He previously uses undergone phenol matricectomy 1-5 on the left foot on 06/26/2015  Objective: Patient appears orientated 3 The right hallux and margins 2-5 right are crusted without any erythema, edema or drainage  The left hallux nail nail margins 1-5 are incurvated and tender direct palpation. There is no surrounding erythema, edema or drainage  Assessment: Satisfactory postoperative progress postop phenol matrxicectomies 1-5 right  Ingrowing medial lateral borders of 1-5 nails left. Patient verbally consents to procedures today  Toes 1-5 left are then blocked with a total of 9 mL of 50-50 mixture of 2% plain Xylocaine and 0.5% plain Marcaine in divided doses. Skin is prepped with Betadine exsanguinated toes 1-5. The medial lateral borders of toenails 1-5 were excised and phenol matrixectomies performed. The wound size for rinse with alcohol and antibiotic compression dressings applied. Patient tolerated procedure without any difficulty. The tourniquets were released and spontaneous capillary fill time 1-5 left was noted. Postoperative oral and written instructions provided. Pain management will include patient's existing use of Toradol and over-the-counter acetaminophen  Reappoint 7 days

## 2015-08-06 ENCOUNTER — Ambulatory Visit (INDEPENDENT_AMBULATORY_CARE_PROVIDER_SITE_OTHER): Payer: Federal, State, Local not specified - PPO | Admitting: Podiatry

## 2015-08-06 ENCOUNTER — Encounter: Payer: Self-pay | Admitting: Podiatry

## 2015-08-06 VITALS — BP 125/77 | HR 92 | Resp 12

## 2015-08-06 DIAGNOSIS — L600 Ingrowing nail: Secondary | ICD-10-CM

## 2015-08-06 NOTE — Patient Instructions (Signed)
Continue to apply topical antibiotic ointment daily until all drainage intense to the toenails 1-5 left

## 2015-08-06 NOTE — Progress Notes (Signed)
   Subjective:    Patient ID: Harold Sullivan, male    DOB: [birth year], 54 y.o.   MRN: [id number]  HPI   this patient presents postop phenol matricectomy 1-5 left on the visit of 07/30/2015. He said minimal discomfort until present. Describes hitting the fifth left toenail the last several days resulting in some bleeding in the area and slight increased pain in the area. Also phenol matrixectomies right 1-5 on 06/26/2015  Review of Systems  All other systems reviewed and are negative.      Objective:   Physical Exam  Patient's daughter present to treatment room Left foot The medial lateral borders of the toenails 1-5 are narrowed with slight local serous drainage and erythema about all nail sites. Slight bleeding fifth left nail margins  Right foot Crusted nail margins 2-5 No crusting on right hallux nail bed      Assessment & Plan:   Assessment: Well-healed surgical sites right nails 1-5 Satisfactory postoperative progress 1-5 left nails Mild trauma fifth left nail  Plan: Patient advised to continue applying topical antibiotic ointment to nail sites 1-5 left daily  Reappoint 4 weeks

## 2015-09-03 ENCOUNTER — Ambulatory Visit: Payer: Federal, State, Local not specified - PPO | Admitting: Podiatry

## 2018-09-26 ENCOUNTER — Ambulatory Visit: Payer: Federal, State, Local not specified - PPO | Admitting: Psychiatry

## 2018-09-26 ENCOUNTER — Encounter: Payer: Self-pay | Admitting: Psychiatry

## 2018-09-26 ENCOUNTER — Encounter

## 2018-09-26 DIAGNOSIS — Z630 Problems in relationship with spouse or partner: Secondary | ICD-10-CM | POA: Diagnosis not present

## 2018-09-26 DIAGNOSIS — F331 Major depressive disorder, recurrent, moderate: Secondary | ICD-10-CM

## 2018-09-26 DIAGNOSIS — G43509 Persistent migraine aura without cerebral infarction, not intractable, without status migrainosus: Secondary | ICD-10-CM | POA: Insufficient documentation

## 2018-09-26 DIAGNOSIS — G43501 Persistent migraine aura without cerebral infarction, not intractable, with status migrainosus: Secondary | ICD-10-CM

## 2018-09-26 DIAGNOSIS — F422 Mixed obsessional thoughts and acts: Secondary | ICD-10-CM | POA: Diagnosis not present

## 2018-09-26 DIAGNOSIS — G43909 Migraine, unspecified, not intractable, without status migrainosus: Secondary | ICD-10-CM

## 2018-09-26 HISTORY — DX: Migraine, unspecified, not intractable, without status migrainosus: G43.909
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
## 2018-10-18 ENCOUNTER — Encounter: Payer: Self-pay | Admitting: Emergency Medicine

## 2018-10-18 DIAGNOSIS — F410 Panic disorder [episodic paroxysmal anxiety] without agoraphobia: Secondary | ICD-10-CM

## 2018-10-18 DIAGNOSIS — F429 Obsessive-compulsive disorder, unspecified: Secondary | ICD-10-CM

## 2018-10-25 ENCOUNTER — Ambulatory Visit: Payer: Federal, State, Local not specified - PPO | Admitting: Psychiatry

## 2018-10-25 ENCOUNTER — Encounter

## 2018-10-25 ENCOUNTER — Encounter: Payer: Self-pay | Admitting: Psychiatry

## 2018-10-25 VITALS — BP 140/80 | HR 98

## 2018-10-25 DIAGNOSIS — F422 Mixed obsessional thoughts and acts: Secondary | ICD-10-CM

## 2018-10-25 DIAGNOSIS — F331 Major depressive disorder, recurrent, moderate: Secondary | ICD-10-CM

## 2018-10-25 DIAGNOSIS — F5105 Insomnia due to other mental disorder: Secondary | ICD-10-CM

## 2018-10-25 DIAGNOSIS — F4001 Agoraphobia with panic disorder: Secondary | ICD-10-CM | POA: Diagnosis not present

## 2018-10-25 DIAGNOSIS — F40240 Claustrophobia: Secondary | ICD-10-CM

## 2018-10-25 MED ORDER — CLONIDINE HCL 0.2 MG PO TABS: 0.2000 mg | ORAL_TABLET | Freq: Four times a day (QID) | ORAL | 1 refills | Status: DC

## 2018-10-25 MED ORDER — CLOMIPRAMINE HCL 50 MG PO CAPS: 150.0000 mg | ORAL_CAPSULE | Freq: Every day | ORAL | 1 refills | Status: DC

## 2018-10-25 MED ORDER — QUETIAPINE FUMARATE 50 MG PO TABS: 150.0000 mg | ORAL_TABLET | Freq: Every day | ORAL | 1 refills | Status: DC

## 2018-10-25 MED ORDER — CLONAZEPAM 1 MG PO TABS: 1.0000 mg | ORAL_TABLET | Freq: Four times a day (QID) | ORAL | 1 refills | Status: DC

## 2018-10-25 MED ORDER — MIRTAZAPINE 15 MG PO TABS: 15.0000 mg | ORAL_TABLET | Freq: Every day | ORAL | 1 refills | Status: DC
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
## 2018-11-15 ENCOUNTER — Ambulatory Visit: Payer: Federal, State, Local not specified - PPO | Admitting: Psychiatry

## 2019-01-24 ENCOUNTER — Ambulatory Visit: Payer: Federal, State, Local not specified - PPO | Admitting: Psychiatry

## 2019-02-26 ENCOUNTER — Ambulatory Visit: Payer: Federal, State, Local not specified - PPO | Admitting: Psychiatry

## 2019-03-28 ENCOUNTER — Ambulatory Visit: Payer: Federal, State, Local not specified - PPO | Admitting: Psychiatry

## 2019-04-24 ENCOUNTER — Other Ambulatory Visit: Payer: Self-pay

## 2019-04-24 ENCOUNTER — Ambulatory Visit (INDEPENDENT_AMBULATORY_CARE_PROVIDER_SITE_OTHER): Payer: Federal, State, Local not specified - PPO | Admitting: Psychiatry

## 2019-04-24 ENCOUNTER — Encounter: Payer: Self-pay | Admitting: Psychiatry

## 2019-04-24 DIAGNOSIS — F4001 Agoraphobia with panic disorder: Secondary | ICD-10-CM

## 2019-04-24 DIAGNOSIS — F5105 Insomnia due to other mental disorder: Secondary | ICD-10-CM | POA: Diagnosis not present

## 2019-04-24 DIAGNOSIS — F422 Mixed obsessional thoughts and acts: Secondary | ICD-10-CM

## 2019-04-24 MED ORDER — CLOMIPRAMINE HCL 50 MG PO CAPS: 200.0000 mg | ORAL_CAPSULE | Freq: Every day | ORAL | 1 refills | Status: DC

## 2019-04-24 MED ORDER — QUETIAPINE FUMARATE 50 MG PO TABS: 150.0000 mg | ORAL_TABLET | Freq: Every day | ORAL | 1 refills | Status: DC

## 2019-04-24 MED ORDER — CLONIDINE HCL 0.3 MG PO TABS: 0.3000 mg | ORAL_TABLET | Freq: Four times a day (QID) | ORAL | 1 refills | Status: DC

## 2019-04-24 MED ORDER — MIRTAZAPINE 15 MG PO TABS: 15.0000 mg | ORAL_TABLET | Freq: Every day | ORAL | 1 refills | Status: DC

## 2019-04-24 MED ORDER — CLONAZEPAM 1 MG PO TABS: 1.0000 mg | ORAL_TABLET | Freq: Four times a day (QID) | ORAL | 1 refills | Status: DC

## 2019-04-24 NOTE — Patient Instructions (Addendum)
Increase clomipramine to 4 of the 50 mg capsules at night for anxiety and OCD.  Will take 4 weeks to help.  Increase clonidine to 0.3 mg four times daily.  Should help anxiety this week.

## 2019-04-24 NOTE — Progress Notes (Signed)
Harold Sullivan [id number] Oct 17, 1961 58 y.o.  Subjective:   [name] is a 58 y.o. (DOB 12-Oct-1961) male.  Chief Complaint:  Chief Complaint  Patient presents with  . Follow-up    Medication Management  . Other    OCD    HPI Harold Sullivan presents to the office today for follow-up of severe OCD and depression.    Last seen in December.  No meds were changed.  Referred urgently/emergently by his primary care doctor today Dr. Melinda Crutch because the patient's anxiety is much more severe in part due to COVID.  A friend died in [place] from [place].  Happened recently last week.  6 days from dx to death and his father died.  Frustrated.  With what's goin on in the country.  Fears it could get worse.  Couldn't seen the friends.  Upset that some people say it's a hoax.  Crying daily.  Stay home mostly.  Sending money to [name] for friends.  Lockdown in his state in [name] to help with them.  Not scared from the Covid just frustrated with it without hope of it ending.  Could not control emotions today.  Was scared to keep appt with me but then came today.  Crying spells.  Consistent with meds.  Trouble going to sleep.  Wants to control his emotions better.  Takes extreme precautions.   Father is not doing well and is crying over this too.  Sx are overall about the same with chronic baseline disabling OCD and mild-mod depression.  Tx complicated by chronic migraine.  No longer on Aimovig DT NR.  Compulsive checking and obsessions.  H realizes it is excessive. Example checks car 4 times. Avoids movies and TV DT fear of claustrophobic scenes which will trigger panic.  Sleep ok on 150 Seroquel with mirtazapine 15mg  HS.  F on 150mg  Seroquel.  Took Aimovig for 6 mos without help. Couldn't afford others but will try another one today  Past Psychiatric Medication Trials: clomipramine 150 , Seroquel, Thorazine HA, others  Review of Systems:  Review of Systems  Neurological: Positive for  headaches. Negative for tremors and weakness.  Psychiatric/Behavioral: Negative for agitation, behavioral problems, confusion, decreased concentration, dysphoric mood, hallucinations, self-injury, sleep disturbance and suicidal ideas. The patient is nervous/anxious. The patient is not hyperactive.     Medications: I have reviewed the patient's current medications.  Current Outpatient Medications  Medication Sig Dispense Refill  . B-D 3CC LUER-LOK SYR 25GX1/2" 25G X 1-1/2" 3 ML MISC     . calcium carbonate (TUMS - DOSED IN MG ELEMENTAL CALCIUM) 500 MG chewable tablet Chew 1 tablet by mouth daily.    . chlorproMAZINE (THORAZINE) 10 MG tablet Take 10 mg by mouth at bedtime.    Marland Kitchen CIALIS 5 MG tablet Take 5 mg by mouth daily as needed for erectile dysfunction.     . clomiPRAMINE (ANAFRANIL) 50 MG capsule Take 3 capsules (150 mg total) by mouth at bedtime. 270 capsule 1  . clonazePAM (KLONOPIN) 1 MG tablet Take 1 tablet (1 mg total) by mouth 4 (four) times daily. 360 tablet 1  . cloNIDine (CATAPRES) 0.2 MG tablet Take 1 tablet (0.2 mg total) by mouth 4 (four) times daily. 360 tablet 1  . fenofibrate (TRICOR) 145 MG tablet Take 145 mg by mouth daily.    Marland Kitchen Ketorolac Tromethamine (TORADOL IJ) Inject 1 application as directed daily as needed (for migraines).     Marland Kitchen lidocaine (XYLOCAINE) 5 % ointment Apply  1 application topically daily as needed for mild pain.     . methocarbamol (ROBAXIN) 500 MG tablet Take 1 tablet (500 mg total) by mouth 4 (four) times daily. 30 tablet 0  . methylPREDNISolone acetate (DEPO-MEDROL) 80 MG/ML injection Inject 80 mg into the muscle once.    . mirtazapine (REMERON) 15 MG tablet Take 1 tablet (15 mg total) by mouth at bedtime. 90 tablet 1  . promethazine (PHENERGAN) 25 MG suppository Place 50 mg rectally every 6 (six) hours as needed for nausea.     Marland Kitchen QUEtiapine (SEROQUEL) 50 MG tablet Take 3 tablets (150 mg total) by mouth at bedtime. 270 tablet 1  . saccharomyces boulardii  (FLORASTOR) 250 MG capsule Take 250 mg by mouth 2 (two) times daily.    . SPRIX 15.75 MG/SPRAY SOLN Apply 1 spray topically daily as needed.      No current facility-administered medications for this visit.     Medication Side Effects: None , no dizziness, sleepiness  Allergies:  Allergies  Allergen Reactions  . Aspirin     Ringing in ears  . Dilaudid [Hydromorphone Hcl]     Migraines  . Codeine     Upset stomach  . Morphine And Related Nausea And Vomiting    Past Medical History:  Diagnosis Date  . Anxiety    aslo has OCD  . Claustrophobia   . Claustrophobia   . Depression   . GERD (gastroesophageal reflux disease)   . Headache(784.0)   . Lipoma of abdominal wall 01/30/2013  . Migraine without status migrainosus, not intractable 09/26/2018  . PONV (postoperative nausea and vomiting)    also has fear of having general anesthesia    No family history on file.  Social History   Socioeconomic History  . Marital status: Married    Spouse name: Not on file  . Number of children: Not on file  . Years of education: Not on file  . Highest education level: Not on file  Occupational History  . Not on file  Social Needs  . Financial resource strain: Not on file  . Food insecurity    Worry: Not on file    Inability: Not on file  . Transportation needs    Medical: Not on file    Non-medical: Not on file  Tobacco Use  . Smoking status: Never Smoker  . Smokeless tobacco: Never Used  Substance and Sexual Activity  . Alcohol use: No  . Drug use: No  . Sexual activity: Not on file  Lifestyle  . Physical activity    Days per week: Not on file    Minutes per session: Not on file  . Stress: Not on file  Relationships  . Social Herbalist on phone: Not on file    Gets together: Not on file    Attends religious service: Not on file    Active member of club or organization: Not on file    Attends meetings of clubs or organizations: Not on file     Relationship status: Not on file  . Intimate partner violence    Fear of current or ex partner: Not on file    Emotionally abused: Not on file    Physically abused: Not on file    Forced sexual activity: Not on file  Other Topics Concern  . Not on file  Social History Narrative  . Not on file    Past Medical History, Surgical history, Social history, and Family history  were reviewed and updated as appropriate.   Please see review of systems for further details on the patient's review from today.   Objective:   Physical Exam:  There were no vitals taken for this visit.  Physical Exam Constitutional:      General: He is in acute distress.     Appearance: Normal appearance.  Neurological:     Mental Status: He is alert.     Motor: No tremor.     Gait: Gait normal.  Psychiatric:        Attention and Perception: Attention and perception normal.        Mood and Affect: Mood is anxious and depressed. Affect is tearful.        Speech: Speech normal.        Behavior: Behavior is not agitated, slowed or aggressive.        Thought Content: Thought content is not paranoid. Thought content does not include homicidal or suicidal ideation.        Cognition and Memory: Cognition normal.     Comments: Chronic severe obsessions. Insight and judgment fair. Chronically talkative with mild pressure.  No other manic.  Still highly phobic of contamination and avoidant.     Lab Review:     Component Value Date/Time   NA 139 02/19/2014 1343   K 4.1 02/19/2014 1343   CL 100 02/19/2014 1343   CO2 25 02/19/2014 1343   GLUCOSE 121 (H) 02/19/2014 1343   BUN 18 02/19/2014 1343   CREATININE 1.00 02/19/2014 1343   CALCIUM 9.8 02/19/2014 1343   GFRNONAA 85 (L) 02/19/2014 1343   GFRAA >90 02/19/2014 1343       Component Value Date/Time   WBC 5.8 02/19/2014 1343   RBC 4.67 02/19/2014 1343   HGB 13.3 02/19/2014 1343   HCT 38.4 (L) 02/19/2014 1343   PLT 185 02/19/2014 1343   MCV 82.2  02/19/2014 1343   MCH 28.5 02/19/2014 1343   MCHC 34.6 02/19/2014 1343   RDW 12.7 02/19/2014 1343    No results found for: POCLITH, LITHIUM   No results found for: PHENYTOIN, PHENOBARB, VALPROATE, CBMZ   .res Assessment: Plan:    Gram was seen today for follow-up and other.  Diagnoses and all orders for this visit:  Mixed obsessional thoughts and acts -     clomiPRAMINE (ANAFRANIL) 50 MG capsule; Take 4 capsules (200 mg total) by mouth at bedtime. -     clonazePAM (KLONOPIN) 1 MG tablet; Take 1 tablet (1 mg total) by mouth 4 (four) times daily.  Panic disorder with agoraphobia -     cloNIDine (CATAPRES) 0.3 MG tablet; Take 1 tablet (0.3 mg total) by mouth 4 (four) times daily.  Insomnia due to mental condition -     mirtazapine (REMERON) 15 MG tablet; Take 1 tablet (15 mg total) by mouth at bedtime. -     QUEtiapine (SEROQUEL) 50 MG tablet; Take 3 tablets (150 mg total) by mouth at bedtime.   Treatment resistant symptoms:  OCD remains severe and disabling.  Has occassions of intrusive thoughts that he cannot let go, even of events from years in the past.  Theme of fear of contamination and harm to his health.  Depression is less severe at this time than in the past.  Highly resistant to change even in meds.  Partly DT leigitmate concerns about worsening HA and partly DT obsssessive fears.  Has had extensive CBT.   He is acutely worse due to COVID  and having friends stop from [place].  He is in emotional distress averse of seeing others not take COVID seriously.  Refer to Dr. Rica Mote as soon as possible.  Supportive therapy and crisis intervention over his distress over Covid and death of his friends recently.  "I cannot bear the pain of the people."  Strongly encourage  increase the clomipramine further.  He's more willing to consider 200 mg daily.  but he's fearful.  Increase clonidine to 0.3 mg four times daily.  Should help anxiety this week..  Disc SE on BP  Reviewed CBT  and ERP for OCD.  He feels stuck.  Discussed potential metabolic side effects associated with atypical antipsychotics, as well as potential risk for movement side effects. Advised pt to contact office if movement side effects occur.     Disc risk of polypharmacy.  This appt was 30 mins.  FU 1 mos  Lynder Parents, MD, DFAPA   Please see After Visit Summary for patient specific instructions.  No future appointments.  No orders of the defined types were placed in this encounter.     -------------------------------

## 2019-05-08 ENCOUNTER — Other Ambulatory Visit: Payer: Self-pay

## 2019-05-08 ENCOUNTER — Ambulatory Visit (INDEPENDENT_AMBULATORY_CARE_PROVIDER_SITE_OTHER): Payer: Federal, State, Local not specified - PPO | Admitting: Psychiatry

## 2019-05-08 DIAGNOSIS — Z630 Problems in relationship with spouse or partner: Secondary | ICD-10-CM | POA: Diagnosis not present

## 2019-05-08 DIAGNOSIS — F331 Major depressive disorder, recurrent, moderate: Secondary | ICD-10-CM | POA: Diagnosis not present

## 2019-05-08 DIAGNOSIS — F422 Mixed obsessional thoughts and acts: Secondary | ICD-10-CM | POA: Diagnosis not present
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
## 2019-05-29 ENCOUNTER — Ambulatory Visit: Payer: Federal, State, Local not specified - PPO | Admitting: Psychiatry

## 2019-05-31 ENCOUNTER — Ambulatory Visit: Payer: Federal, State, Local not specified - PPO | Admitting: Psychiatry

## 2019-06-27 ENCOUNTER — Ambulatory Visit: Payer: Federal, State, Local not specified - PPO | Admitting: Psychiatry

## 2019-11-25 ENCOUNTER — Emergency Department (HOSPITAL_COMMUNITY)
Admission: EM | Admit: 2019-11-25 | Discharge: 2019-11-25 | Disposition: A | Discharge status: Home / Self Care | Payer: Federal, State, Local not specified - PPO | Attending: Emergency Medicine | Admitting: Emergency Medicine

## 2019-11-25 ENCOUNTER — Emergency Department (HOSPITAL_COMMUNITY): Payer: Federal, State, Local not specified - PPO

## 2019-11-25 ENCOUNTER — Other Ambulatory Visit: Payer: Self-pay

## 2019-11-25 DIAGNOSIS — Z79899 Other long term (current) drug therapy: Secondary | ICD-10-CM | POA: Diagnosis not present

## 2019-11-25 DIAGNOSIS — U071 COVID-19: Secondary | ICD-10-CM | POA: Diagnosis not present

## 2019-11-25 DIAGNOSIS — J029 Acute pharyngitis, unspecified: Secondary | ICD-10-CM | POA: Diagnosis present

## 2019-11-25 LAB — FIBRINOGEN: Fibrinogen: 407 mg/dL (ref 210–475)

## 2019-11-25 LAB — CBC WITH DIFFERENTIAL/PLATELET
Abs Immature Granulocytes: 0.02 10*3/uL (ref 0.00–0.07)
Basophils Absolute: 0 10*3/uL (ref 0.0–0.1)
Basophils Relative: 1 %
Eosinophils Absolute: 0.1 10*3/uL (ref 0.0–0.5)
Eosinophils Relative: 1 %
HCT: 37.7 % — ABNORMAL LOW (ref 39.0–52.0)
Hemoglobin: 12.8 g/dL — ABNORMAL LOW (ref 13.0–17.0)
Immature Granulocytes: 0 %
Lymphocytes Relative: 11 %
Lymphs Abs: 0.6 10*3/uL — ABNORMAL LOW (ref 0.7–4.0)
MCH: 27.9 pg (ref 26.0–34.0)
MCHC: 34 g/dL (ref 30.0–36.0)
MCV: 82.3 fL (ref 80.0–100.0)
Monocytes Absolute: 0.9 10*3/uL (ref 0.1–1.0)
Monocytes Relative: 16 %
Neutro Abs: 4 10*3/uL (ref 1.7–7.7)
Neutrophils Relative %: 71 %
Platelets: 159 10*3/uL (ref 150–400)
RBC: 4.58 MIL/uL (ref 4.22–5.81)
RDW: 12.2 % (ref 11.5–15.5)
WBC: 5.6 10*3/uL (ref 4.0–10.5)
nRBC: 0 % (ref 0.0–0.2)

## 2019-11-25 LAB — COMPREHENSIVE METABOLIC PANEL
ALT: 32 U/L (ref 0–44)
AST: 33 U/L (ref 15–41)
Albumin: 4.1 g/dL (ref 3.5–5.0)
Alkaline Phosphatase: 63 U/L (ref 38–126)
Anion gap: 10 (ref 5–15)
BUN: 12 mg/dL (ref 6–20)
CO2: 23 mmol/L (ref 22–32)
Calcium: 9.2 mg/dL (ref 8.9–10.3)
Chloride: 97 mmol/L — ABNORMAL LOW (ref 98–111)
Creatinine, Ser: 1.27 mg/dL — ABNORMAL HIGH (ref 0.61–1.24)
GFR calc Af Amer: >60 mL/min (ref >60)
GFR calc non Af Amer: >60 mL/min (ref >60)
Glucose, Bld: 138 mg/dL — ABNORMAL HIGH (ref 70–99)
Potassium: 4.4 mmol/L (ref 3.5–5.1)
Sodium: 130 mmol/L — ABNORMAL LOW (ref 135–145)
Total Bilirubin: 0.6 mg/dL (ref 0.3–1.2)
Total Protein: 7 g/dL (ref 6.5–8.1)

## 2019-11-25 LAB — POC SARS CORONAVIRUS 2 AG -  ED: SARS Coronavirus 2 Ag: POSITIVE — AB

## 2019-11-25 LAB — PROCALCITONIN: Procalcitonin: <0.1 ng/mL

## 2019-11-25 LAB — LACTIC ACID, PLASMA
Lactic Acid, Venous: 2 mmol/L (ref 0.5–1.9)
Lactic Acid, Venous: 2.1 mmol/L (ref 0.5–1.9)

## 2019-11-25 LAB — TRIGLYCERIDES: Triglycerides: 348 mg/dL — ABNORMAL HIGH (ref <150)

## 2019-11-25 LAB — D-DIMER, QUANTITATIVE: D-Dimer, Quant: 0.37 ug/mL-FEU (ref 0.00–0.50)

## 2019-11-25 LAB — LACTATE DEHYDROGENASE: LDH: 121 U/L (ref 98–192)

## 2019-11-25 LAB — FERRITIN: Ferritin: 90 ng/mL (ref 24–336)

## 2019-11-25 LAB — C-REACTIVE PROTEIN: CRP: 1.8 mg/dL — ABNORMAL HIGH (ref <1.0)

## 2019-11-25 MED ORDER — ONDANSETRON HCL 4 MG/2ML IJ SOLN
4.0000 mg | Freq: Once | INTRAMUSCULAR | Status: AC
  Administered 2019-11-25: 4 mg via INTRAVENOUS
  Filled 2019-11-25: qty 2

## 2019-11-25 MED ORDER — ACETAMINOPHEN 500 MG PO TABS: 500.0000 mg | ORAL_TABLET | Freq: Four times a day (QID) | ORAL | 0 refills | Status: AC | PRN

## 2019-11-25 MED ORDER — KETOROLAC TROMETHAMINE 15 MG/ML IJ SOLN
15.0000 mg | Freq: Once | INTRAMUSCULAR | Status: AC
  Administered 2019-11-25: 07:00:00 15 mg via INTRAVENOUS
  Filled 2019-11-25: qty 1

## 2019-11-25 MED ORDER — ONDANSETRON 4 MG PO TBDP: 4.0000 mg | ORAL_TABLET | Freq: Three times a day (TID) | ORAL | 0 refills | Status: AC | PRN

## 2019-11-25 NOTE — ED Provider Notes (Addendum)
[facility] DEPT Provider Note   CSN: [id number] Arrival date & time: 11/25/19  0630     History Chief Complaint  Patient presents with  . COVID +    Harold Sullivan is a 59 y.o. male with PMH significant for migraine headaches who presents to the ED with a 1 day history of flu-like symptoms.  Patient reports that yesterday he developed a sore throat which progressed to difficulty breathing, headache, fevers and chills, and loose stools.  He reports that he regularly takes Toradol for his migraine, but that it has not relieved his symptoms.  He reports that he cannot take morphine due to allergy.  He reports that his T-max was 39 F.  Patient states that he has been drinking plenty of fluids.  He denies any chest pain, history of clots, neurologic deficits, abdominal pain, uncontrolled nausea vomiting, or other symptoms.  HPI     Past Medical History:  Diagnosis Date  . Anxiety    aslo has OCD  . Claustrophobia   . Claustrophobia   . Depression   . GERD (gastroesophageal reflux disease)   . Headache(784.0)   . Lipoma of abdominal wall 01/30/2013  . Migraine without status migrainosus, not intractable 09/26/2018  . PONV (postoperative nausea and vomiting)    also has fear of having general anesthesia    Patient Active Problem List   Diagnosis Date Noted  . OCD (obsessive compulsive disorder) 10/18/2018  . Panic 10/18/2018  . Migraine aura, persistent 09/26/2018  . Lipoma of arm - left upper medial arm 01/18/2014  . Lipoma of scalp 01/18/2014  . Claustrophobia     Past Surgical History:  Procedure Laterality Date  . LIPOMA EXCISION Bilateral 02/26/2014   Procedure: EXCISION OF SUBCUTAENOUS MASSES RIGHT UPPER ABDOMINAL WALL AND LEFT UPPER ARM;  Surgeon: [name]. Georgette Dover, MD;  Location: [facility];  Service: General;  Laterality: Bilateral;  RIGHT UPPER ABDOMINAL WALL, LEFT UPPER ARM, SCALP  . lipoma removal on head  15 yrs ago  . MASS EXCISION  Bilateral 02/08/2013   Procedure: Minor Excision Mass Right and Left Side of Abdomen;  Surgeon: Haywood Lasso, MD;  Location: [facility];  Service: General;  Laterality: Bilateral;  . MASS EXCISION N/A 02/26/2014   Procedure: EXCISION OF SUBCUTANEOUS MASS OF POSTERIOR SCALP;  Surgeon: [name]. Georgette Dover, MD;  Location: [facility];  Service: General;  Laterality: N/A;  . SHOULDER SURGERY Right approx 2011       No family history on file.  Social History   Tobacco Use  . Smoking status: Never Smoker  . Smokeless tobacco: Never Used  Substance Use Topics  . Alcohol use: No  . Drug use: No    Home Medications Prior to Admission medications   Medication Sig Start Date End Date Taking? Authorizing Provider  acetaminophen (TYLENOL) 500 MG tablet Take 1 tablet (500 mg total) by mouth every 6 (six) hours as needed. 11/25/19   Corena Herter, PA-C  B-D 3CC LUER-LOK SYR 25GX1/2" 25G X 1-1/2" 3 ML MISC  01/14/14   [provider]  Butalbital-APAP-Caffeine 50-300-40 MG CAPS Take 1 capsule by mouth every 4 (four) hours as needed for migraine. 10/18/19   [provider]  calcium carbonate (TUMS - DOSED IN MG ELEMENTAL CALCIUM) 500 MG chewable tablet Chew 1 tablet by mouth daily.    [provider]  chlorproMAZINE (THORAZINE) 10 MG tablet Take 10 mg by mouth at bedtime. 04/04/18   [provider]  CIALIS 5 MG tablet Take 5 mg by mouth daily as needed for erectile dysfunction.  02/22/13   [provider]  clomiPRAMINE (ANAFRANIL) 50 MG capsule Take 4 capsules (200 mg total) by mouth at bedtime. 04/24/19   Cottle, Billey Co., MD  clonazePAM (KLONOPIN) 1 MG tablet Take 1 tablet (1 mg total) by mouth 4 (four) times daily. 04/24/19   Cottle, Billey Co., MD  cloNIDine (CATAPRES) 0.3 MG tablet Take 1 tablet (0.3 mg total) by mouth 4 (four) times daily. 04/24/19   Cottle, Billey Co., MD  fenofibrate (TRICOR) 145 MG tablet Take 145 mg by mouth daily.     [provider]  ketorolac (TORADOL) 60 MG/2ML SOLN injection Inject 2 mLs into the muscle 2 (two) times daily as needed for migraine. 10/24/19   [provider]  Ketorolac Tromethamine (TORADOL IJ) Inject 1 application as directed daily as needed (for migraines).     [provider]  lidocaine (LIDODERM) 5 % Place 1 patch onto the skin daily. 10/29/19   [provider]  lidocaine (XYLOCAINE) 5 % ointment Apply 1 application topically daily as needed for mild pain.  12/19/12   [provider]  metFORMIN (GLUCOPHAGE) 500 MG tablet Take 500 mg by mouth daily with breakfast. 10/29/19   [provider]  methocarbamol (ROBAXIN) 500 MG tablet Take 1 tablet (500 mg total) by mouth 4 (four) times daily. 03/18/14   Donnie Mesa, MD  methylPREDNISolone acetate (DEPO-MEDROL) 80 MG/ML injection Inject 80 mg into the muscle once.    [provider]  mirtazapine (REMERON) 15 MG tablet Take 1 tablet (15 mg total) by mouth at bedtime. 04/24/19   Cottle, Billey Co., MD  ondansetron (ZOFRAN ODT) 4 MG disintegrating tablet Take 1 tablet (4 mg total) by mouth every 8 (eight) hours as needed for nausea or vomiting. 11/25/19   Corena Herter, PA-C  promethazine (PHENERGAN) 25 MG suppository Place 50 mg rectally every 6 (six) hours as needed for nausea.     [provider]  QUEtiapine (SEROQUEL) 50 MG tablet Take 3 tablets (150 mg total) by mouth at bedtime. 04/24/19   Cottle, Billey Co., MD  saccharomyces boulardii (FLORASTOR) 250 MG capsule Take 250 mg by mouth 2 (two) times daily.    [provider]  SPRIX 15.75 MG/SPRAY SOLN Apply 1 spray topically daily as needed.  01/17/13   [provider]    Allergies    Aspirin, Dilaudid [hydromorphone hcl], Codeine, and Morphine and related  Review of Systems   Review of Systems  All other systems reviewed and are negative.   Physical Exam Updated Vital Signs BP (!) 149/81 (BP  Location: Right Arm)   Pulse 89   Temp 99.2 F (37.3 C) (Oral)   Resp 20   SpO2 98%   Physical Exam Vitals and nursing note reviewed. Exam conducted with a chaperone present.  Constitutional:      Appearance: Normal appearance.  HENT:     Head: Normocephalic and atraumatic.  Eyes:     General: No scleral icterus.    Conjunctiva/sclera: Conjunctivae normal.  Cardiovascular:     Rate and Rhythm: Normal rate and regular rhythm.     Pulses: Normal pulses.     Heart sounds: Normal heart sounds.  Pulmonary:     Effort: Pulmonary effort is normal. No respiratory distress.  Musculoskeletal:     Cervical back: Normal range of motion.  Skin:  General: Skin is dry.  Neurological:     Mental Status: He is alert.     GCS: GCS eye subscore is 4. GCS verbal subscore is 5. GCS motor subscore is 6.  Psychiatric:        Mood and Affect: Mood normal.        Behavior: Behavior normal.        Thought Content: Thought content normal.     ED Results / Procedures / Treatments   Labs (all labs ordered are listed, but only abnormal results are displayed) Labs Reviewed  LACTIC ACID, PLASMA - Abnormal; Notable for the following components:      Result Value   Lactic Acid, Venous 2.0 (*)    All other components within normal limits  CBC WITH DIFFERENTIAL/PLATELET - Abnormal; Notable for the following components:   Hemoglobin 12.8 (*)    HCT 37.7 (*)    Lymphs Abs 0.6 (*)    All other components within normal limits  COMPREHENSIVE METABOLIC PANEL - Abnormal; Notable for the following components:   Sodium 130 (*)    Chloride 97 (*)    Glucose, Bld 138 (*)    Creatinine, Ser 1.27 (*)    All other components within normal limits  TRIGLYCERIDES - Abnormal; Notable for the following components:   Triglycerides 348 (*)    All other components within normal limits  POC SARS CORONAVIRUS 2 AG -  ED - Abnormal; Notable for the following components:   SARS Coronavirus 2 Ag POSITIVE (*)    All  other components within normal limits  CULTURE, BLOOD (ROUTINE X 2)  CULTURE, BLOOD (ROUTINE X 2)  D-DIMER, QUANTITATIVE (NOT AT [facility])  PROCALCITONIN  LACTATE DEHYDROGENASE  FIBRINOGEN  LACTIC ACID, PLASMA  FERRITIN  C-REACTIVE PROTEIN    EKG None  Radiology DG Chest Portable 1 View  Result Date: 11/25/2019 CLINICAL DATA:  Progressive sore throat.  COVID positive EXAM: PORTABLE CHEST 1 VIEW COMPARISON:  02/06/2016 FINDINGS: Normal mediastinum and cardiac silhouette. Normal pulmonary vasculature. No evidence of effusion, infiltrate, or pneumothorax. No acute bony abnormality. IMPRESSION: Normal chest radiograph. Electronically Signed   By: Suzy Bouchard M.D.   On: 11/25/2019 08:40    Procedures Procedures (including critical care time)  Medications Ordered in ED Medications  ketorolac (TORADOL) 15 MG/ML injection 15 mg (15 mg Intravenous Given 11/25/19 0724)  ondansetron (ZOFRAN) injection 4 mg (4 mg Intravenous Given 11/25/19 0724)    ED Course  I have reviewed the triage vital signs and the nursing notes.  Pertinent labs & imaging results that were available during my care of the patient were reviewed by me and considered in my medical decision making (see chart for details).    MDM Rules/Calculators/A&P                      Patient tested positive for rapid COVID-19 testing.  On my examination, patient is oxygenating 90% on RA and is neither tachypneic, nor tachycardic.  He is inquiring about admission, however he is hemodynamically stable and neurovascularly intact.  He endorses flu-like symptoms, but is in no acute distress and is safe for discharge.  Patient reports that his son and his parents live with him, but plans to isolate for the next couple of weeks given his new COVID-19 diagnosis.  Patient has a large prescription at home of ketorolac that he takes for his migraines.  He plans to take that for his fevers and chills, as well.  He also will take Tylenol for  breakthrough fevers and chills.  He plans to buy a pulse oximeter and has a thermometer at home.  Discussed case with Dr. Stark Jock who agrees with assessment and plan.  His chest x-ray demonstrated no pneumonia and do not feel as though steroids are warranted at this time.  He was found to be a little hyponatremic, but rather than giving fluids, would prefer have him consume Gatorade's or chicken broth at home.  Discussed return precautions with the patient. All of the evaluation and work-up results were discussed with the patient and any family at bedside. They were provided opportunity to ask any additional questions and have none at this time. They have expressed understanding of verbal discharge instructions as well as return precautions and are agreeable to the plan.  Spoke to patient's son who voiced understanding of today's work-up and is also agreeable to the plan.  Harold Sullivan was evaluated in Emergency Department on 11/25/2019 for the symptoms described in the history of present illness. He was evaluated in the context of the global COVID-19 pandemic, which necessitated consideration that the patient might be at risk for infection with the SARS-CoV-2 virus that causes COVID-19. Institutional protocols and algorithms that pertain to the evaluation of patients at risk for COVID-19 are in a state of rapid change based on information released by regulatory bodies including the CDC and federal and state organizations. These policies and algorithms were followed during the patient's care in the ED.  Final Clinical Impression(s) / ED Diagnoses Final diagnoses:  [id number]    Rx / DC Orders ED Discharge Orders         Ordered    ondansetron (ZOFRAN ODT) 4 MG disintegrating tablet  Every 8 hours PRN     11/25/19 0944    acetaminophen (TYLENOL) 500 MG tablet  Every 6 hours PRN     11/25/19 0944           Corena Herter, PA-C 11/25/19 0949    Corena Herter, PA-C 11/25/19 [id number]    Veryl Speak, MD 11/25/19 1444    Corena Herter, PA-C 12/01/19 [id number]    Veryl Speak, MD 12/02/19 0403

## 2019-11-25 NOTE — Discharge Instructions (Addendum)
Please read the attachment on COVID-19.  Please take your ketorolac for fevers and chills in addition to your migraine symptoms.  Please take Tylenol, as well, for breakthrough fevers and chills.  It is important that you measure your temperature regularly and it would be wise to obtain a pulse oximeter if you are concerned for your oxygenation status.  Your sodium was low today, please replenish with Gatorade or chicken broth.  Ensure that you are eating regular meals.  Please follow isolation precautions.  Return to the ED or seek medical attention immediately should he develop any uncontrolled nausea and vomiting, fevers or chills uncontrolled with ketorolac and Tylenol, difficulty breathing, worsening chest pain, or any other new or worsening symptoms.

## 2019-11-25 NOTE — ED Triage Notes (Signed)
Per EMS: Pt is coming from home with c/o sore throat for the past two days. Pt reports his sore throat has become progressively worse since then. Pt developed a headache and diarrhea yesterday evening. Pt has no known covid exposure and no signs of respiratory distress. Hx anxiety.  EMS VITALS: BP 138/80 HR 90 SPO2 98% RA RR 18 TEMP 103.5  Son ([id number]) Anne Shutter

## 2019-11-25 NOTE — ED Notes (Signed)
Please call Son [name] at [phone]

## 2019-11-26 ENCOUNTER — Telehealth (HOSPITAL_BASED_OUTPATIENT_CLINIC_OR_DEPARTMENT_OTHER): Payer: Self-pay | Admitting: Emergency Medicine

## 2019-11-27 ENCOUNTER — Ambulatory Visit (INDEPENDENT_AMBULATORY_CARE_PROVIDER_SITE_OTHER): Payer: Federal, State, Local not specified - PPO | Admitting: Psychiatry

## 2019-11-27 DIAGNOSIS — F422 Mixed obsessional thoughts and acts: Secondary | ICD-10-CM

## 2019-11-27 DIAGNOSIS — F331 Major depressive disorder, recurrent, moderate: Secondary | ICD-10-CM

## 2019-11-27 DIAGNOSIS — U071 COVID-19: Secondary | ICD-10-CM

## 2019-11-27 LAB — CULTURE, BLOOD (ROUTINE X 2): Special Requests: ADEQUATE

## 2019-11-27 NOTE — Progress Notes (Signed)
Psychotherapy Progress Note [facility] Luan Moore, PhD LP  Patient ID: Harold Sullivan     MRN: [id number]     Therapy format: Individual psychotherapy Date: 11/27/2019      Start: 3:34p     Stop: 4:20p     Time Spent: 46 min Location: Telehealth visit -- I connected with this patient by an approved telecommunication method (audio only), with his informed consent, and verifying identity and patient privacy.  I was located at my office and patient at his home.  As needed, we discussed the limitations, risks, and security and privacy concerns associated with telehealth service, including the availability and conditions which currently govern in-person appointments and the possibility that 3rd-party payment may not be fully guaranteed and he may be responsible for charges.  After he indicated understanding, we proceeded with the session.  Also discussed treatment planning, as needed, including ongoing verbal agreement with the plan, the opportunity to ask and answer all questions, his demonstrated understanding of instructions, and his readiness to call the office should symptoms worsen or he feels he is in a crisis state and needs more immediate and tangible assistance.   Session narrative (presenting needs, interim history, self-report of stressors and symptoms, applications of prior therapy, status changes, and interventions made in session) Initial difficulty reaching PT, who did not know about email and did not answer first phone call.  Last seen in June, was afraid of getting COVID then.  Back now after hospitalized overnight Friday with COVID, at home by self now.  Lost taste, bad diarrhea one day (water ran clean through), chills, temp hit 106 one night, called 911.  Had headache, did not respond to Tordol x3.  Father hospitalized currently but stable, recovering.  Son got it, too.  Has rented an [place] 15 days so the three convalescents can quarantine away from other  family members.  "My ethical duty."  Still running some fever, taking nausea medication and Tylenol, having alternating chills and fever.  Sleeping maybe 2 hours at a time, attributed to [place].  Is knowledgeable about Tylenol dose limit, will take half dose sometimes.  Monitoring his O2, will have 92-93 sometimes in the morning but usually holding 97.  Knows he can go back to [employer] on demand if needed.  Has some food delivery coming to his home.  Left [name] 15 months ago now, divorce about to finalize.  Felipa Evener she lives in [name], she has been involved with "big guys" (powerful men?) in [place].  Obviously has worked through his own fear of her giving him a hard time, her threat that she would sue for half the house if he did not cooperate with her getting a green card.  Did sell his own house, she did sign the separation, did not ask anything.     Amazed and angry at the pandemic, the president's behavior, perceived disregard for public welfare, and then the [employer] 2 weeks ago.  Thankful there was no further loss of life.  Reminds him of things he used to see in [place] (from [name], where he emigrated from).  Hopeful of new government for unity and health care.  Emotionally, most affected by his father being in the hospital alone, and wondering why he caught it when he is so careful about germs on an ongoing basis.  PT has been father's main support and help on a daily basis before this.  Has stopped trying to figure out how he got it,  is "facing reality" that God let it happen.  Does cry some times, tries not to show it.  Says family members were teasing him before he got the virus about his precautions, saying (in [place]) if you are riding on a camel, the dog can still bite you.  It has, and he seems to be taking it in good humor with them.  Worry for mother, whether she will come down with COVID (awaiting test).    Tearful closing the session, wants to be able to come in.  Says he  cried watching the coup attempt, and other news coverage.  Reflected that it had to be that evocative watching what looked like his adopted country turning into the kind of 3rd world mess he saw before he immigrated.  Reframed crying as healthy behavior, his brain taking care of itself, not a moral failing.  Therapeutic modalities: Cognitive Behavioral Therapy and Solution-Oriented/Positive Psychology  Mental Status/Observations:  Appearance:   Not assessed     Behavior:  Appropriate  Motor:  Not assessed  Speech/Language:   Clear and Coherent  Affect:  Not assessed  Mood:  anxious  Thought process:  normal  Thought content:    Obsessions  Sensory/Perceptual disturbances:    WNL  Orientation:  Fully oriented  Attention:  Good  Concentration:  Good  Memory:  WNL  Insight:    Good  Judgment:   Good  Impulse Control:  Good   Risk Assessment: Danger to Self: No Self-injurious Behavior: No Danger to Others: No Physical Aggression / Violence: No Duty to Warn: No Access to Firearms a concern: No  Assessment of progress:  situational setback(s)  Diagnosis:   ICD-10-CM   1. Major depressive disorder, recurrent episode, moderate (HCC)  F33.1   2. Mixed obsessional thoughts and acts  F42.2   3. COVID-19 virus infection  U07.1    Plan:  . Self-affirm permission to cry if/when needed and reframe it as healthy expression and brain self-regulating . Tend COVID recovery shrewdly and stand ready to check medical advice . Put down thinking about how he may have introduced the virus to family, emphasize instead noticing immune systems overcoming . Other recommendations/advice as noted above . Continue to utilize previously learned skills ad lib . Maintain medication as prescribed and work faithfully with relevant prescriber(s) if any changes are desired or seem indicated . Call the clinic on-call service, present to ER, or call 911 if any life-threatening psychiatric crisis Return in about 3  weeks (around 12/18/2019). Next scheduled in this office Visit date not found   Blanchie Serve, PhD Luan Moore, PhD LP Clinical Psychologist, [facility] [address] [place], [place] [address] [phone]

## 2019-11-28 NOTE — Progress Notes (Signed)
ED Antimicrobial Stewardship Positive Culture Follow Up   Harold Sullivan is an 59 y.o. male who presented to [facility] on 11/25/2019 with a chief complaint of  Chief Complaint  Patient presents with  . COVID +    Recent Results (from the past 720 hour(s))  Blood Culture (routine x 2)     Status: None (Preliminary result)   Collection Time: 11/25/19  7:12 AM   Specimen: BLOOD RIGHT HAND  Result Value Ref Range Status   Specimen Description   Final    BLOOD RIGHT HAND Performed at [facility], [place] [address], [place], [place] [address]    Special Requests   Final    BOTTLES DRAWN AEROBIC AND ANAEROBIC Blood Culture adequate volume Performed at [facility] [address] [place], [place] [address]    Culture   Final    NO GROWTH 3 DAYS Performed at [facility], [place] [address] [place], [place] [address]    Report Status PENDING  Incomplete  Blood Culture (routine x 2)     Status: Abnormal   Collection Time: 11/25/19  7:12 AM   Specimen: BLOOD  Result Value Ref Range Status   Specimen Description   Final    BLOOD RIGHT ANTECUBITAL Performed at [facility] [address] [place], [place] [address]    Special Requests   Final    BOTTLES DRAWN AEROBIC AND ANAEROBIC Blood Culture adequate volume Performed at [facility] [address], [place],  [address]    Culture  Setup Time   Final    GRAM POSITIVE COCCI IN CLUSTERS AEROBIC BOTTLE ONLY CRITICAL RESULT CALLED TO, READ BACK BY AND VERIFIED WITH: Rosemarie Ax RN, AT [phone] 11/26/19 BY D. VANHOOK    Culture (A)  Final    STAPHYLOCOCCUS SPECIES (COAGULASE NEGATIVE) THE SIGNIFICANCE OF ISOLATING THIS ORGANISM FROM A SINGLE SET OF BLOOD CULTURES WHEN MULTIPLE SETS ARE DRAWN IS UNCERTAIN. PLEASE NOTIFY THE MICROBIOLOGY DEPARTMENT WITHIN ONE WEEK IF SPECIATION AND SENSITIVITIES ARE REQUIRED. Performed at [facility], [place] [address] [place],  [address]    Report Status 11/27/2019 FINAL  Final   1/17 1 of 4 bottles with Coag neg Staph species Patient found Covid +, not septic, not admitted, no fluids given in ED  Considered contaminant - did not suggest treatment  [name] 11/28/2019, 4:35 PM Clinical Pharmacist [phone]

## 2019-11-30 LAB — CULTURE, BLOOD (ROUTINE X 2)
Culture: NO GROWTH
Special Requests: ADEQUATE

## 2019-12-15 ENCOUNTER — Ambulatory Visit: Payer: Federal, State, Local not specified - PPO | Attending: Internal Medicine

## 2019-12-15 ENCOUNTER — Other Ambulatory Visit: Payer: Self-pay

## 2019-12-15 DIAGNOSIS — Z23 Encounter for immunization: Secondary | ICD-10-CM | POA: Insufficient documentation

## 2019-12-15 NOTE — Progress Notes (Signed)
   Covid-19 Vaccination Clinic  Name:  Harold Sullivan    MRN: [id number] DOB: [birth year]  12/15/2019  Harold Sullivan was observed post Covid-19 immunization for 15 minutes without incidence. He was provided with Vaccine Information Sheet and instruction to access the V-Safe system.   Harold Sullivan was instructed to call 911 with any severe reactions post vaccine: Marland Kitchen Difficulty breathing  . Swelling of your face and throat  . A fast heartbeat  . A bad rash all over your body  . Dizziness and weakness    Immunizations Administered    Name Date Dose VIS Date Route   Pfizer COVID-19 Vaccine 12/15/2019  9:04 AM 0.3 mL 10/19/2019 Intramuscular   Manufacturer: [employer]   Lot: [id number]   [place]: [id number]

## 2019-12-20 ENCOUNTER — Other Ambulatory Visit: Payer: Self-pay | Admitting: Psychiatry

## 2019-12-20 DIAGNOSIS — F4001 Agoraphobia with panic disorder: Secondary | ICD-10-CM

## 2019-12-20 DIAGNOSIS — F5105 Insomnia due to other mental disorder: Secondary | ICD-10-CM

## 2019-12-20 DIAGNOSIS — F422 Mixed obsessional thoughts and acts: Secondary | ICD-10-CM

## 2019-12-20 NOTE — Telephone Encounter (Signed)
Pt called requesting refills for Clonidine 0.3 mg, Quetiapine 50 mg, Clonzapam 1 mg and Mirtizapine 15 mg at [facility] [address] [place]

## 2019-12-20 NOTE — Telephone Encounter (Signed)
Patient did schedule for 03/04  Last apt 04/2019 was due back 1 month

## 2019-12-21 ENCOUNTER — Telehealth: Payer: Self-pay | Admitting: Psychiatry

## 2019-12-21 ENCOUNTER — Other Ambulatory Visit: Payer: Self-pay | Admitting: Psychiatry

## 2019-12-21 DIAGNOSIS — F5105 Insomnia due to other mental disorder: Secondary | ICD-10-CM

## 2019-12-21 NOTE — Telephone Encounter (Signed)
Patient is scheduled to follow-up appointment for March.  He is overdue by several months.

## 2019-12-21 NOTE — Telephone Encounter (Signed)
Pt requesting a refill on Quetiapine. He stated he only has 1 and will need this today or he will end up in the hospital. He would like a call back . Overdue for appt that was not scheduled today. Stated the  family had [place]. Fill at the [facility] on [address].

## 2019-12-21 NOTE — Telephone Encounter (Signed)
Refill for Quetiapine submitted and patient now has apt on 01/10/2020

## 2020-01-08 ENCOUNTER — Ambulatory Visit: Payer: Federal, State, Local not specified - PPO | Attending: Internal Medicine

## 2020-01-08 DIAGNOSIS — Z23 Encounter for immunization: Secondary | ICD-10-CM

## 2020-01-10 ENCOUNTER — Ambulatory Visit (INDEPENDENT_AMBULATORY_CARE_PROVIDER_SITE_OTHER): Payer: Federal, State, Local not specified - PPO | Admitting: Psychiatry

## 2020-01-10 ENCOUNTER — Encounter: Payer: Self-pay | Admitting: Psychiatry

## 2020-01-10 ENCOUNTER — Other Ambulatory Visit: Payer: Self-pay

## 2020-01-10 DIAGNOSIS — G43501 Persistent migraine aura without cerebral infarction, not intractable, with status migrainosus: Secondary | ICD-10-CM

## 2020-01-10 DIAGNOSIS — F4001 Agoraphobia with panic disorder: Secondary | ICD-10-CM

## 2020-01-10 DIAGNOSIS — F40240 Claustrophobia: Secondary | ICD-10-CM

## 2020-01-10 DIAGNOSIS — F422 Mixed obsessional thoughts and acts: Secondary | ICD-10-CM | POA: Diagnosis not present

## 2020-01-10 DIAGNOSIS — F5105 Insomnia due to other mental disorder: Secondary | ICD-10-CM

## 2020-01-10 DIAGNOSIS — F331 Major depressive disorder, recurrent, moderate: Secondary | ICD-10-CM | POA: Diagnosis not present

## 2020-01-10 MED ORDER — MIRTAZAPINE 15 MG PO TABS: 15.0000 mg | ORAL_TABLET | Freq: Every day | ORAL | 1 refills | Status: DC

## 2020-01-10 MED ORDER — CLOMIPRAMINE HCL 50 MG PO CAPS: 150.0000 mg | ORAL_CAPSULE | Freq: Every day | ORAL | 0 refills | Status: DC

## 2020-01-10 MED ORDER — QUETIAPINE FUMARATE 100 MG PO TABS: 200.0000 mg | ORAL_TABLET | Freq: Every day | ORAL | 0 refills | Status: DC

## 2020-01-10 MED ORDER — CLONIDINE HCL 0.3 MG PO TABS: 0.3000 mg | ORAL_TABLET | Freq: Four times a day (QID) | ORAL | 0 refills | Status: DC

## 2020-01-10 MED ORDER — CLONAZEPAM 1 MG PO TABS: 1.0000 mg | ORAL_TABLET | Freq: Four times a day (QID) | ORAL | 0 refills | Status: DC

## 2020-01-10 NOTE — Progress Notes (Signed)
Jerett Talavera [id number] 07-15-1961 59 y.o.  Subjective:   [name] is a 59 y.o. (DOB 1960-12-20) male.  Chief Complaint:  Chief Complaint  Patient presents with  . Follow-up    Medication Management  . Other    OCD  . Anxiety  . Headache    HPI Markevius Sirko presents to the office today for follow-up of severe OCD and depression.    Last seen in June 2020. Referred urgently/emergently by his primary care doctor today Dr. Melinda Crutch because the patient's anxiety is much more severe in part due to COVID. The following was recommended: Strongly encourage  increase the clomipramine further.  He's more willing to consider 200 mg daily.  but he's fearful.  Historically he has been encouraged to do this but has always refused due to fear of headaches. Increase clonidine to 0.3 mg four times daily.  Should help anxiety this week..  Disc SE on BP  Remained scared of Covid since here.  2 friends died from [place].  Then son and family and he got Covid.  He struggled 4 days in the hospital with breathing. Worst experience in my life.  Father 10 days hospitalized with Covid and fell with cervical neck fx and brain bleed.  No current CNS problems.  [name] day before yesterday.  Still devastated and fearful of getting Covid.  Whole family got the shot.  Got HA and body aches after 2nd shot.  After Covid fears of Covid are less.   HA frequency is worse and not recovering quickly and fearful about the future.    Not getting on my feet.  Sometimes dizziness.  Afraid to drive bc of it.  Overall depression is manageable.  Energy and concentration are adequate.  He still grossly impaired by OCD and obsessions and anxiety.  Asks I see see sister bc of depression and anxiety and migraine.    Compulsive checking and obsessions.  H realizes it is excessive. Example checks car 4 times. Avoids movies and TV DT fear of claustrophobic scenes which will trigger panic.  Sleep ok on 150  Seroquel with mirtazapine 15mg  HS.  F on 150mg  Seroquel.  Took Aimovig for 6 mos without help. Couldn't afford others but will try another one today  Past Psychiatric Medication Trials: clomipramine 150 , Seroquel, Thorazine HA, others  Review of Systems:  Review of Systems  Gastrointestinal: Positive for nausea.  Neurological: Positive for headaches. Negative for tremors and weakness.       Reduced smell and taste  Psychiatric/Behavioral: Negative for agitation, behavioral problems, confusion, decreased concentration, dysphoric mood, hallucinations, self-injury, sleep disturbance and suicidal ideas. The patient is nervous/anxious. The patient is not hyperactive.    Lately HA daily  Medications: I have reviewed the patient's current medications.  Current Outpatient Medications  Medication Sig Dispense Refill  . acetaminophen (TYLENOL) 500 MG tablet Take 1 tablet (500 mg total) by mouth every 6 (six) hours as needed. 30 tablet 0  . B-D 3CC LUER-LOK SYR 25GX1/2" 25G X 1-1/2" 3 ML MISC     . Butalbital-APAP-Caffeine 50-300-40 MG CAPS Take 1 capsule by mouth every 4 (four) hours as needed for migraine.    . calcium carbonate (TUMS - DOSED IN MG ELEMENTAL CALCIUM) 500 MG chewable tablet Chew 1 tablet by mouth daily.    . chlorproMAZINE (THORAZINE) 10 MG tablet Take 10 mg by mouth at bedtime.    Marland Kitchen CIALIS 5 MG tablet Take 5 mg  by mouth daily as needed for erectile dysfunction.     . clomiPRAMINE (ANAFRANIL) 50 MG capsule Take 4 capsules (200 mg total) by mouth at bedtime. 360 capsule 1  . clonazePAM (KLONOPIN) 1 MG tablet TAKE 1 TABLET(1 MG) BY MOUTH FOUR TIMES DAILY 360 tablet 0  . cloNIDine (CATAPRES) 0.3 MG tablet TAKE 1 TABLET(0.3 MG) BY MOUTH FOUR TIMES DAILY. 360 tablet 0  . fenofibrate (TRICOR) 145 MG tablet Take 145 mg by mouth daily.    Marland Kitchen ketorolac (TORADOL) 60 MG/2ML SOLN injection Inject 2 mLs into the muscle 2 (two) times daily as needed for migraine.    Marland Kitchen Ketorolac Tromethamine  (TORADOL IJ) Inject 1 application as directed daily as needed (for migraines).     Marland Kitchen lidocaine (LIDODERM) 5 % Place 1 patch onto the skin daily.    Marland Kitchen lidocaine (XYLOCAINE) 5 % ointment Apply 1 application topically daily as needed for mild pain.     . metFORMIN (GLUCOPHAGE) 500 MG tablet Take 500 mg by mouth daily with breakfast.    . methocarbamol (ROBAXIN) 500 MG tablet Take 1 tablet (500 mg total) by mouth 4 (four) times daily. 30 tablet 0  . methylPREDNISolone acetate (DEPO-MEDROL) 80 MG/ML injection Inject 80 mg into the muscle once.    . mirtazapine (REMERON) 15 MG tablet TAKE 1 TABLET(15 MG) BY MOUTH AT BEDTIME 90 tablet 0  . ondansetron (ZOFRAN ODT) 4 MG disintegrating tablet Take 1 tablet (4 mg total) by mouth every 8 (eight) hours as needed for nausea or vomiting. 20 tablet 0  . promethazine (PHENERGAN) 25 MG suppository Place 50 mg rectally every 6 (six) hours as needed for nausea.     Marland Kitchen QUEtiapine (SEROQUEL) 50 MG tablet TAKE 3 TABLETS(150 MG) BY MOUTH AT BEDTIME 270 tablet 0  . saccharomyces boulardii (FLORASTOR) 250 MG capsule Take 250 mg by mouth 2 (two) times daily.    . SPRIX 15.75 MG/SPRAY SOLN Apply 1 spray topically daily as needed.      No current facility-administered medications for this visit.    Medication Side Effects: None , no dizziness, sleepiness  Allergies:  Allergies  Allergen Reactions  . Aspirin     Ringing in ears  . Dilaudid [Hydromorphone Hcl]     Migraines  . Codeine     Upset stomach  . Morphine And Related Nausea And Vomiting    Past Medical History:  Diagnosis Date  . Anxiety    aslo has OCD  . Claustrophobia   . Claustrophobia   . Depression   . GERD (gastroesophageal reflux disease)   . Headache(784.0)   . Lipoma of abdominal wall 01/30/2013  . Migraine without status migrainosus, not intractable 09/26/2018  . PONV (postoperative nausea and vomiting)    also has fear of having general anesthesia    History reviewed. No pertinent  family history.  Social History   Socioeconomic History  . Marital status: Married    Spouse name: Not on file  . Number of children: Not on file  . Years of education: Not on file  . Highest education level: Not on file  Occupational History  . Not on file  Tobacco Use  . Smoking status: Never Smoker  . Smokeless tobacco: Never Used  Substance and Sexual Activity  . Alcohol use: No  . Drug use: No  . Sexual activity: Not on file  Other Topics Concern  . Not on file  Social History Narrative  . Not on file   Social  Determinants of Health   Financial Resource Strain:   . Difficulty of Paying Living Expenses: Not on file  Food Insecurity:   . Worried About Charity fundraiser in the Last Year: Not on file  . Ran Out of Food in the Last Year: Not on file  Transportation Needs:   . Lack of Transportation (Medical): Not on file  . Lack of Transportation (Non-Medical): Not on file  Physical Activity:   . Days of Exercise per Week: Not on file  . Minutes of Exercise per Session: Not on file  Stress:   . Feeling of Stress : Not on file  Social Connections:   . Frequency of Communication with Friends and Family: Not on file  . Frequency of Social Gatherings with Friends and Family: Not on file  . Attends Religious Services: Not on file  . Active Member of Clubs or Organizations: Not on file  . Attends Archivist Meetings: Not on file  . Marital Status: Not on file  Intimate Partner Violence:   . Fear of Current or Ex-Partner: Not on file  . Emotionally Abused: Not on file  . Physically Abused: Not on file  . Sexually Abused: Not on file    Past Medical History, Surgical history, Social history, and Family history were reviewed and updated as appropriate.   Please see review of systems for further details on the patient's review from today.   Objective:   Physical Exam:  There were no vitals taken for this visit.  Physical Exam Constitutional:       General: He is in acute distress.     Appearance: Normal appearance.  Neurological:     Mental Status: He is alert.     Motor: No tremor.     Gait: Gait normal.  Psychiatric:        Attention and Perception: Attention and perception normal.        Mood and Affect: Mood is anxious and depressed. Affect is tearful.        Speech: Speech normal.        Behavior: Behavior is not agitated, slowed or aggressive.        Thought Content: Thought content is not paranoid. Thought content does not include homicidal or suicidal ideation.        Cognition and Memory: Cognition normal.     Comments: Chronic severe obsessions and anxiety.   More somatic. Insight and judgment fair. Chronically talkative with mild pressure.  No other manic.  Still highly phobic of contamination and avoidant.     Lab Review:     Component Value Date/Time   NA 130 (L) 11/25/2019 0709   K 4.4 11/25/2019 0709   CL 97 (L) 11/25/2019 0709   CO2 23 11/25/2019 0709   GLUCOSE 138 (H) 11/25/2019 0709   BUN 12 11/25/2019 0709   CREATININE 1.27 (H) 11/25/2019 0709   CALCIUM 9.2 11/25/2019 0709   PROT 7.0 11/25/2019 0709   ALBUMIN 4.1 11/25/2019 0709   AST 33 11/25/2019 0709   ALT 32 11/25/2019 0709   ALKPHOS 63 11/25/2019 0709   BILITOT 0.6 11/25/2019 0709   GFRNONAA >60 11/25/2019 0709   GFRAA >60 11/25/2019 0709       Component Value Date/Time   WBC 5.6 11/25/2019 0709   RBC 4.58 11/25/2019 0709   HGB 12.8 (L) 11/25/2019 0709   HCT 37.7 (L) 11/25/2019 0709   PLT 159 11/25/2019 0709   MCV 82.3 11/25/2019 0709  MCH 27.9 11/25/2019 0709   MCHC 34.0 11/25/2019 0709   RDW 12.2 11/25/2019 0709   LYMPHSABS 0.6 (L) 11/25/2019 0709   MONOABS 0.9 11/25/2019 0709   EOSABS 0.1 11/25/2019 0709   BASOSABS 0.0 11/25/2019 0709    No results found for: POCLITH, LITHIUM   No results found for: PHENYTOIN, PHENOBARB, VALPROATE, CBMZ   .res Assessment: Plan:    Raine was seen today for follow-up, other, anxiety  and headache.  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 We discussed Treatment resistant symptoms:  OCD remains severe and disabling.  Has occassions of intrusive thoughts that he cannot let go, even of events from years in the past.  Theme of fear of contamination and harm to his health.  Depression is less severe at this time than in the past.  Highly resistant to change even in meds.  Partly DT leigitmate concerns about worsening HA and partly DT obsssessive fears.  Has had extensive CBT.   He is acutely worse due to having had COVID along with most of his family members and having friends die from [place].  He is in emotional distress averse of seeing others not take COVID seriously.  Continue counseling with Dr. Rica Mote  Educated about meaning of Covid vaccination.  CBT around his ongoing fears of Covid despite having gotten Covid and the vaccination with a little bit of benefit today.  Supportive therapy and crisis intervention over his distress over Covid and death of his friends recently.  "I cannot bear the pain of the people."  Discussed in detail the inverse relationship between mirtazapine and sleep benefit.  He has been at various dosages before its not helpful for OCD therefore we will maintain the lower dose mirtazapine 15 mg at bedtime for sleep  Strongly encourage  increase the clomipramine further.  He's more willing to consider 150 mg daily after his headaches return to baseline..  but he's fearful.  Increase clonidine to 0.3 mg four times daily.  Should help anxiety this week..  Disc  SE on BP  Agreed to increase quetiapine to 200 mg HS for TR axneity  Discussed potential metabolic side effects associated with atypical antipsychotics, as well as potential risk for movement side effects. Advised pt to contact office if movement side effects occur.     Disc risk of polypharmacy.  This appt was 30 mins.  FU 1 mos  Lynder Parents, MD, DFAPA   Please see After Visit Summary for patient specific instructions.  No future appointments.  No orders of the defined types were placed in this encounter.     -------------------------------

## 2020-02-11 ENCOUNTER — Other Ambulatory Visit: Payer: Self-pay

## 2020-02-11 ENCOUNTER — Ambulatory Visit (INDEPENDENT_AMBULATORY_CARE_PROVIDER_SITE_OTHER): Payer: Federal, State, Local not specified - PPO | Admitting: Psychiatry

## 2020-02-11 DIAGNOSIS — F331 Major depressive disorder, recurrent, moderate: Secondary | ICD-10-CM

## 2020-02-11 DIAGNOSIS — F422 Mixed obsessional thoughts and acts: Secondary | ICD-10-CM

## 2020-02-11 DIAGNOSIS — Z630 Problems in relationship with spouse or partner: Secondary | ICD-10-CM | POA: Diagnosis not present

## 2020-02-11 DIAGNOSIS — Z636 Dependent relative needing care at home: Secondary | ICD-10-CM

## 2020-02-11 DIAGNOSIS — G43501 Persistent migraine aura without cerebral infarction, not intractable, with status migrainosus: Secondary | ICD-10-CM

## 2020-02-11 DIAGNOSIS — Z8616 Personal history of COVID-19: Secondary | ICD-10-CM

## 2020-02-11 NOTE — Progress Notes (Signed)
Psychotherapy Progress Note [facility] Luan Moore, PhD LP  Patient ID: Harold Sullivan     MRN: [id number] Therapy format: Individual psychotherapy Date: 02/11/2020      Start: 2:12p     Stop: 3:02p     Time Spent: 50 min Location: In-person   Session narrative (presenting needs, interim history, self-report of stressors and symptoms, applications of prior therapy, status changes, and interventions made in session) Brings niece into session briefly.  She has begun also with Dr. Clovis Pu, will be starting therapy for herself next week.  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  COVID-infected herself as part of the family outbreak mid-January, recovered now.    63 family members overall were infected, father hospitalized after passing out due to reduced oxygen and breaking collarbone.  Now everyone recovered and vaccinated, though he was originally deferred as a middle aged adult, then accepted with them as family caregiver.  Son and daughter also got it.  All had been careful, but suspects either his son [name] or niece [name] were the first infected, just not talking openly about it so no one feels blamed.  Brother from [name] is staying here, telecommuting for [employer], helping with father.   Jan 17th COVID happened.  Preoccupied with having gotten emotional and crying then.  Makes him feel weak, disordered to cry.  Admits it started with last summer's outbreak in [place] and losing a couple friends in their 35s.  He stayed away for months trying not to show/feel tears and to get COVID.  Attempted to normalize tears again but he goes into philosophical discussion where he has been challenging orthodoxy among family and friends.  Recalls personal history of touching Mother Helene Kelp in 1979, when she visited during the [place].  Got spanked by his mother for receiving a blanket that could have gone to someone more needy.  (He wanted a memento, so  allowed himself to be given the care package.)  Moved by remembering how desperately poor many people were, and still are.  Has grievances with rich people and God for the way it has perpetuated.  Tears up during session -- deep empathy mixed with his own weariness.  Divorce from [place] has finalized.  In retrospect, believes she was gold-digging when they met, reiterates that he caught her communicating with others plotting against him to profit from his death and her inheritance.  Chose simply to let her go, not interfere with her immigration status, understands her to be with another man now out of state.  Therapeutic modalities: Cognitive Behavioral Therapy and Solution-Oriented/Positive Psychology  Mental Status/Observations:  Appearance:   Casual     Behavior:  Appropriate  Motor:  Normal  Speech/Language:   Clear and Coherent and still heavy accent  Affect:  Appropriate and Tearful  Mood:  depressed  Thought process:  normal  Thought content:    WNL and Rumination  Sensory/Perceptual disturbances:    WNL  Orientation:  Fully oriented  Attention:  Fair    Concentration:  Good  Memory:  WNL  Insight:    Good  Judgment:   Good  Impulse Control:  Good   Risk Assessment: Danger to Self: No Self-injurious Behavior: No Danger to Others: No Physical Aggression / Violence: No Duty to Warn: No Access to Firearms a concern: No  Assessment of progress:  stabilized  Diagnosis:   ICD-10-CM   1. Mixed obsessional thoughts and acts  F42.2   2. Major  depressive disorder, recurrent episode, moderate (HCC)  F33.1   3. 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 Relationship problem between partners  Z63.0   5. Caregiver stress  Z63.6   6. History of COVID-19  Z86.16    Plan:  . Maintain normalizing activities and caregiving with family . Try to accept tears and crying as normal, temporary, not signs of  dysfunction . F/U on OCD, phobias, and anxiety impact these days . Other recommendations/advice as may be noted above . Continue to utilize previously learned skills ad lib . Maintain medication as prescribed and work faithfully with relevant prescriber(s) if any changes are desired or seem indicated . Call the clinic on-call service, present to ER, or call 911 if any life-threatening psychiatric crisis Return 2-4 wks. . Already scheduled visit in this office 03/13/2020.  Blanchie Serve, PhD Luan Moore, PhD LP Clinical Psychologist, [facility] [address] [place],  [address] [phone]

## 2020-03-05 ENCOUNTER — Ambulatory Visit: Payer: Federal, State, Local not specified - PPO | Admitting: Psychiatry

## 2020-03-10 ENCOUNTER — Ambulatory Visit: Payer: Federal, State, Local not specified - PPO | Admitting: Psychiatry

## 2020-03-13 ENCOUNTER — Ambulatory Visit: Payer: Federal, State, Local not specified - PPO | Admitting: Psychiatry

## 2020-04-08 ENCOUNTER — Other Ambulatory Visit: Payer: Self-pay | Admitting: Psychiatry

## 2020-04-08 DIAGNOSIS — F5105 Insomnia due to other mental disorder: Secondary | ICD-10-CM

## 2020-04-21 ENCOUNTER — Encounter: Payer: Self-pay | Admitting: Psychiatry

## 2020-04-21 ENCOUNTER — Ambulatory Visit (INDEPENDENT_AMBULATORY_CARE_PROVIDER_SITE_OTHER): Payer: Federal, State, Local not specified - PPO | Admitting: Psychiatry

## 2020-04-21 ENCOUNTER — Other Ambulatory Visit: Payer: Self-pay

## 2020-04-21 DIAGNOSIS — F5105 Insomnia due to other mental disorder: Secondary | ICD-10-CM

## 2020-04-21 DIAGNOSIS — G43501 Persistent migraine aura without cerebral infarction, not intractable, with status migrainosus: Secondary | ICD-10-CM | POA: Diagnosis not present

## 2020-04-21 DIAGNOSIS — Z636 Dependent relative needing care at home: Secondary | ICD-10-CM

## 2020-04-21 DIAGNOSIS — F4001 Agoraphobia with panic disorder: Secondary | ICD-10-CM | POA: Diagnosis not present

## 2020-04-21 DIAGNOSIS — F331 Major depressive disorder, recurrent, moderate: Secondary | ICD-10-CM | POA: Diagnosis not present

## 2020-04-21 DIAGNOSIS — F422 Mixed obsessional thoughts and acts: Secondary | ICD-10-CM | POA: Diagnosis not present

## 2020-04-21 DIAGNOSIS — F40240 Claustrophobia: Secondary | ICD-10-CM

## 2020-04-21 MED ORDER — CLONAZEPAM 1 MG PO TABS: 1.0000 mg | ORAL_TABLET | Freq: Four times a day (QID) | ORAL | 1 refills | Status: DC

## 2020-04-21 MED ORDER — MIRTAZAPINE 30 MG PO TABS: 30.0000 mg | ORAL_TABLET | Freq: Every day | ORAL | 1 refills | Status: DC

## 2020-04-21 MED ORDER — CLOMIPRAMINE HCL 50 MG PO CAPS: 150.0000 mg | ORAL_CAPSULE | Freq: Every day | ORAL | 0 refills | Status: DC

## 2020-04-21 MED ORDER — QUETIAPINE FUMARATE 100 MG PO TABS: ORAL_TABLET | ORAL | 1 refills | Status: DC

## 2020-04-21 MED ORDER — CLONIDINE HCL 0.3 MG PO TABS: 0.3000 mg | ORAL_TABLET | Freq: Four times a day (QID) | ORAL | 1 refills | Status: DC

## 2020-04-21 NOTE — Progress Notes (Signed)
Harold Sullivan [id number] 1961/08/15 59 y.o.  Subjective:   [name] is a 59 y.o. (DOB February 04, 1961) male.  Chief Complaint:  Chief Complaint  Patient presents with  . Follow-up    Med changes  . Anxiety    OCD  . Depression  . Medication Problem    Chronic headaches    HPI Harold Sullivan presents to the office today for follow-up of severe OCD and depression.    seen in June 2020. Referred urgently/emergently by his primary care doctor today Dr. Melinda Crutch because the patient's anxiety is much more severe in part due to COVID. The following was recommended: Strongly encourage  increase the clomipramine further.  He's more willing to consider 200 mg daily.  but he's fearful.  Historically he has been encouraged to do this but has always refused due to fear of headaches. Increase clonidine to 0.3 mg four times daily.  Should help anxiety this week..  Disc SE on BP  01/10/2020 appointment with the following note noted: Remained scared of Covid since here.  2 friends died from [place].  Then son and family and he got Covid.  He struggled 4 days in the hospital with breathing. Worst experience in my life.  Father 10 days hospitalized with Covid and fell with cervical neck fx and brain bleed.  No current CNS problems.  [name] day before yesterday.  Still devastated and fearful of getting Covid.  Whole family got the shot.  Got HA and body aches after 2nd shot. After Covid fears of Covid are less.   HA frequency is worse and not recovering quickly and fearful about the future.    Not getting on my feet.  Sometimes dizziness.  Afraid to drive bc of it.  Overall depression is manageable.  Energy and concentration are adequate.  He still grossly impaired by OCD and obsessions and anxiety. Asks I see see sister bc of depression and anxiety and migraine.   Compulsive checking and obsessions.  H realizes it is excessive. Example checks car 4 times. Avoids movies and TV DT fear of  claustrophobic scenes which will trigger panic. Plan:Strongly encourage  increase the clomipramine further.  He's more willing to consider 150 mg daily after his headaches return to baseline..  but he's fearful. Increase clonidine to 0.3 mg four times daily.  Should help anxiety this week..  Disc SE on BP Agreed to increase quetiapine to 200 mg HS for TR axneity . 04/21/2020 appointment with the following noted: Sad over his father's depression which is TR.  Pt also deals with depression.  Sad most of the time and especially after having Covid in January.  The situation is not good with GF left him and parents not well.  He feels responsible.  Looking for NH but they don't want to go.  Alone and doesn't want to be.  Sleep is a problem.  Doesn't go to sleep until late and then sleeps late.  3-10 AM and wants 8-9 hours of sleep.  Less than 7 hours triggers HA.  Asks about increasing mirtazapine.   Only taking quetiapine 100 mg hs.   Did not increase clomipramine DT HA.   No interest or enjoyment or motivation. Crying.  F on 150mg  Seroquel.& getting TMS #24 so far without help.  F history of [employer] and strong man.  Took Aimovig for 6 mos without help. Couldn't afford others but will try another one today  Past Psychiatric Medication Trials:  clomipramine 150 , Seroquel, Thorazine HA, others  Review of Systems:  Review of Systems  Constitutional: Positive for fatigue.  Gastrointestinal: Positive for nausea.  Neurological: Positive for headaches. Negative for tremors and weakness.       Reduced smell and taste  Psychiatric/Behavioral: Negative for agitation, behavioral problems, confusion, decreased concentration, dysphoric mood, hallucinations, self-injury, sleep disturbance and suicidal ideas. The patient is nervous/anxious. The patient is not hyperactive.    Lately HA daily  Medications: I have reviewed the patient's current medications.  Current Outpatient Medications  Medication Sig Dispense  Refill  . acetaminophen (TYLENOL) 500 MG tablet Take 1 tablet (500 mg total) by mouth every 6 (six) hours as needed. 30 tablet 0  . B-D 3CC LUER-LOK SYR 25GX1/2" 25G X 1-1/2" 3 ML MISC     . Butalbital-APAP-Caffeine 50-300-40 MG CAPS Take 1 capsule by mouth every 4 (four) hours as needed for migraine.    . calcium carbonate (TUMS - DOSED IN MG ELEMENTAL CALCIUM) 500 MG chewable tablet Chew 1 tablet by mouth daily.    . chlorproMAZINE (THORAZINE) 10 MG tablet Take 10 mg by mouth at bedtime.    Marland Kitchen CIALIS 5 MG tablet Take 5 mg by mouth daily as needed for erectile dysfunction.     . clomiPRAMINE (ANAFRANIL) 50 MG capsule Take 3 capsules (150 mg total) by mouth at bedtime. 270 capsule 0  . clonazePAM (KLONOPIN) 1 MG tablet Take 1 tablet (1 mg total) by mouth 4 (four) times daily. 360 tablet 1  . cloNIDine (CATAPRES) 0.3 MG tablet Take 1 tablet (0.3 mg total) by mouth 4 (four) times daily. 360 tablet 1  . fenofibrate (TRICOR) 145 MG tablet Take 145 mg by mouth daily.    Marland Kitchen ketorolac (TORADOL) 60 MG/2ML SOLN injection Inject 2 mLs into the muscle 2 (two) times daily as needed for migraine.    Marland Kitchen Ketorolac Tromethamine (TORADOL IJ) Inject 1 application as directed daily as needed (for migraines).     Marland Kitchen lidocaine (LIDODERM) 5 % Place 1 patch onto the skin daily.    Marland Kitchen lidocaine (XYLOCAINE) 5 % ointment Apply 1 application topically daily as needed for mild pain.     . metFORMIN (GLUCOPHAGE) 500 MG tablet Take 500 mg by mouth daily with breakfast.    . methocarbamol (ROBAXIN) 500 MG tablet Take 1 tablet (500 mg total) by mouth 4 (four) times daily. 30 tablet 0  . methylPREDNISolone acetate (DEPO-MEDROL) 80 MG/ML injection Inject 80 mg into the muscle once.    . mirtazapine (REMERON) 30 MG tablet Take 1 tablet (30 mg total) by mouth at bedtime. 90 tablet 1  . ondansetron (ZOFRAN ODT) 4 MG disintegrating tablet Take 1 tablet (4 mg total) by mouth every 8 (eight) hours as needed for nausea or vomiting. 20 tablet  0  . promethazine (PHENERGAN) 25 MG suppository Place 50 mg rectally every 6 (six) hours as needed for nausea.     Marland Kitchen QUEtiapine (SEROQUEL) 100 MG tablet TAKE 2 TABLETS(200 MG) BY MOUTH AT BEDTIME 180 tablet 1  . saccharomyces boulardii (FLORASTOR) 250 MG capsule Take 250 mg by mouth 2 (two) times daily.    . SPRIX 15.75 MG/SPRAY SOLN Apply 1 spray topically daily as needed.      No current facility-administered medications for this visit.    Medication Side Effects: None , no dizziness, sleepiness  Allergies:  Allergies  Allergen Reactions  . Aspirin     Ringing in ears  . Dilaudid [Hydromorphone Hcl]  Migraines  . Codeine     Upset stomach  . Morphine And Related Nausea And Vomiting    Past Medical History:  Diagnosis Date  . Anxiety    aslo has OCD  . Claustrophobia   . Claustrophobia   . Depression   . GERD (gastroesophageal reflux disease)   . Headache(784.0)   . Lipoma of abdominal wall 01/30/2013  . Migraine without status migrainosus, not intractable 09/26/2018  . PONV (postoperative nausea and vomiting)    also has fear of having general anesthesia    Family History  Problem Relation Age of Onset  . Anxiety disorder Sister   . Depression Sister   . Migraines Sister     Social History   Socioeconomic History  . Marital status: Married    Spouse name: Not on file  . Number of children: Not on file  . Years of education: Not on file  . Highest education level: Not on file  Occupational History  . Not on file  Tobacco Use  . Smoking status: Never Smoker  . Smokeless tobacco: Never Used  Substance and Sexual Activity  . Alcohol use: No  . Drug use: No  . Sexual activity: Not on file  Other Topics Concern  . Not on file  Social History Narrative  . Not on file   Social Determinants of Health   Financial Resource Strain:   . Difficulty of Paying Living Expenses:   Food Insecurity:   . Worried About Charity fundraiser in the Last Year:   .  Arboriculturist in the Last Year:   Transportation Needs:   . Film/video editor (Medical):   Marland Kitchen Lack of Transportation (Non-Medical):   Physical Activity:   . Days of Exercise per Week:   . Minutes of Exercise per Session:   Stress:   . Feeling of Stress :   Social Connections:   . Frequency of Communication with Friends and Family:   . Frequency of Social Gatherings with Friends and Family:   . Attends Religious Services:   . Active Member of Clubs or Organizations:   . Attends Archivist Meetings:   Marland Kitchen Marital Status:   Intimate Partner Violence:   . Fear of Current or Ex-Partner:   . Emotionally Abused:   Marland Kitchen Physically Abused:   . Sexually Abused:     Past Medical History, Surgical history, Social history, and Family history were reviewed and updated as appropriate.   Please see review of systems for further details on the patient's review from today.   Objective:   Physical Exam:  There were no vitals taken for this visit.  Physical Exam Constitutional:      General: He is not in acute distress.    Appearance: Normal appearance.  Neurological:     Mental Status: He is alert.     Motor: No tremor.     Gait: Gait normal.  Psychiatric:        Attention and Perception: Attention and perception normal.        Mood and Affect: Mood is anxious and depressed. Affect is not tearful.        Speech: Speech normal.        Behavior: Behavior is not agitated, slowed or aggressive.        Thought Content: Thought content is not paranoid. Thought content does not include homicidal or suicidal ideation.        Cognition and Memory: Cognition normal.  Comments: Chronic severe obsessions and anxiety.   Insight and judgment fair. Chronically talkative with mild pressure.  No other manic.  Still highly phobic of contamination and avoidant.     Lab Review:     Component Value Date/Time   NA 130 (L) 11/25/2019 0709   K 4.4 11/25/2019 0709   CL 97 (L) 11/25/2019  0709   CO2 23 11/25/2019 0709   GLUCOSE 138 (H) 11/25/2019 0709   BUN 12 11/25/2019 0709   CREATININE 1.27 (H) 11/25/2019 0709   CALCIUM 9.2 11/25/2019 0709   PROT 7.0 11/25/2019 0709   ALBUMIN 4.1 11/25/2019 0709   AST 33 11/25/2019 0709   ALT 32 11/25/2019 0709   ALKPHOS 63 11/25/2019 0709   BILITOT 0.6 11/25/2019 0709   GFRNONAA >60 11/25/2019 0709   GFRAA >60 11/25/2019 0709       Component Value Date/Time   WBC 5.6 11/25/2019 0709   RBC 4.58 11/25/2019 0709   HGB 12.8 (L) 11/25/2019 0709   HCT 37.7 (L) 11/25/2019 0709   PLT 159 11/25/2019 0709   MCV 82.3 11/25/2019 0709   MCH 27.9 11/25/2019 0709   MCHC 34.0 11/25/2019 0709   RDW 12.2 11/25/2019 0709   LYMPHSABS 0.6 (L) 11/25/2019 0709   MONOABS 0.9 11/25/2019 0709   EOSABS 0.1 11/25/2019 0709   BASOSABS 0.0 11/25/2019 0709    No results found for: POCLITH, LITHIUM   No results found for: PHENYTOIN, PHENOBARB, VALPROATE, CBMZ   .res Assessment: Plan:    Carden was seen today for follow-up, anxiety, depression and medication problem.  Diagnoses and all orders for this visit:  Major depressive disorder, recurrent episode, moderate (HCC)  Mixed obsessional thoughts and acts -     clomiPRAMINE (ANAFRANIL) 50 MG capsule; Take 3 capsules (150 mg total) by mouth at bedtime. -     clonazePAM (KLONOPIN) 1 MG tablet; Take 1 tablet (1 mg total) by mouth 4 (four) times daily.  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  Insomnia due to mental condition -     mirtazapine (REMERON) 30 MG tablet; Take 1 tablet (30 mg total) by mouth at bedtime. -     QUEtiapine (SEROQUEL) 100 MG tablet; TAKE 2 TABLETS(200 MG) BY MOUTH AT BEDTIME  Claustrophobia  Caregiver stress   Greater than 50% of 45 min face to face time with patient was  spent on counseling and coordination of care. We discussed Treatment resistant symptoms:  OCD remains severe and disabling.  Has occassions of intrusive thoughts that he cannot let go, even of events from years in the past.  Theme of fear of contamination and harm to his health.  Depression is less severe at this time than in the past.  Highly resistant to change even in meds.  Partly DT leigitmate concerns about worsening HA and partly DT obsssessive fears.  Has had extensive CBT.   He is acutely worse due to having had COVID along with most of his family members and having friends die from [place].  He is in emotional distress averse of seeing others not take COVID seriously.  Continue counseling with Dr. Rica Mote  Educated about meaning of Covid vaccination.  CBT around his ongoing fears of Covid despite having gotten Covid and the vaccination.  He is less fearful of Covid than  he was at the last visit.  Given that his father has been receiving [place] he is more open to the idea of considering [facility] for his treatment resistant depression and OCD.  We discussed that there are different types of coils for treating OCD versus standard major depression and that is indeed worth considering.  He wants to wait until his father finishes [place] to consider it for himself.  We discussed how the patient's more likely to respond then is his elderly father.  Supportive therapy and crisis intervention over his distress over Covid and death of his friends recently.  "I cannot bear the pain of the people."  Discussed in detail the inverse relationship between mirtazapine and sleep benefit.  He has been at various dosages before its not helpful for OCD therefore we will maintain the lower dose mirtazapine 15 mg at bedtime for sleep .  However it's OK to try increasing the mirtazapine to 30 mg nightly for depression.    Strongly encourage  increase the clomipramine further.  He'snot willing to consider 150 mg daily until after  his headaches return to baseline..  but he's fearful.  Continue clonidine to 0.3 mg four times daily.  Should help anxiety this week..  Disc SE on BP  Agreed to increase quetiapine to 150- 200 mg HS for insomnia and depression and TR axneity  Discussed potential metabolic side effects associated with atypical antipsychotics, as well as potential risk for movement side effects. Advised pt to contact office if movement side effects occur.     Disc risk of polypharmacy.  This appt was 30 mins.  FU  mos  Lynder Parents, MD, DFAPA   Please see After Visit Summary for patient specific instructions.  Future Appointments  Date Time Provider [place]  04/22/2020  4:00 PM Blanchie Serve, PhD [facility] None    No orders of the defined types were placed in this encounter.     -------------------------------

## 2020-04-21 NOTE — Patient Instructions (Signed)
Increase mirtazapine to 30 mg nightly for depression.  Increase quetiapine to 150 or 200 mg as needed for sleep.

## 2020-04-22 ENCOUNTER — Ambulatory Visit (INDEPENDENT_AMBULATORY_CARE_PROVIDER_SITE_OTHER): Payer: Federal, State, Local not specified - PPO | Admitting: Psychiatry

## 2020-04-22 ENCOUNTER — Telehealth: Payer: Self-pay | Admitting: Psychiatry

## 2020-04-22 ENCOUNTER — Other Ambulatory Visit: Payer: Self-pay | Admitting: Psychiatry

## 2020-04-22 DIAGNOSIS — F422 Mixed obsessional thoughts and acts: Secondary | ICD-10-CM | POA: Diagnosis not present

## 2020-04-22 DIAGNOSIS — F5105 Insomnia due to other mental disorder: Secondary | ICD-10-CM

## 2020-04-22 DIAGNOSIS — F40240 Claustrophobia: Secondary | ICD-10-CM

## 2020-04-22 DIAGNOSIS — Z636 Dependent relative needing care at home: Secondary | ICD-10-CM | POA: Diagnosis not present

## 2020-04-22 MED ORDER — QUETIAPINE FUMARATE 200 MG PO TABS: 200.0000 mg | ORAL_TABLET | Freq: Every day | ORAL | 0 refills | Status: DC

## 2020-04-22 NOTE — Telephone Encounter (Signed)
Noted thank you

## 2020-04-22 NOTE — Telephone Encounter (Signed)
Sent RX quetiapine 200 mg nightly

## 2020-04-22 NOTE — Telephone Encounter (Signed)
Aulton went to pick up her quetiapine but the pharmacy said it was too early to refill.  The prescription they said it the same dose of 100mg  so they can't fill. It will need to be a change in dose.  Please call [facility] to discuss the change or send in a new prescription with a different dose.
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
## 2020-05-24 ENCOUNTER — Other Ambulatory Visit: Payer: Self-pay

## 2020-05-24 ENCOUNTER — Emergency Department (HOSPITAL_COMMUNITY): Payer: Federal, State, Local not specified - PPO

## 2020-05-24 ENCOUNTER — Emergency Department (HOSPITAL_COMMUNITY)
Admission: EM | Admit: 2020-05-24 | Discharge: 2020-05-24 | Disposition: A | Discharge status: Home / Self Care | Payer: Federal, State, Local not specified - PPO | Attending: Emergency Medicine | Admitting: Emergency Medicine

## 2020-05-24 DIAGNOSIS — Y939 Activity, unspecified: Secondary | ICD-10-CM | POA: Insufficient documentation

## 2020-05-24 DIAGNOSIS — Y999 Unspecified external cause status: Secondary | ICD-10-CM | POA: Diagnosis not present

## 2020-05-24 DIAGNOSIS — S0181XA Laceration without foreign body of other part of head, initial encounter: Secondary | ICD-10-CM

## 2020-05-24 DIAGNOSIS — Y929 Unspecified place or not applicable: Secondary | ICD-10-CM | POA: Insufficient documentation

## 2020-05-24 DIAGNOSIS — H7292 Unspecified perforation of tympanic membrane, left ear: Secondary | ICD-10-CM

## 2020-05-24 MED ORDER — OXYCODONE HCL 5 MG PO TABS
5.0000 mg | ORAL_TABLET | Freq: Once | ORAL | Status: DC
  Filled 2020-05-24: qty 1

## 2020-05-24 MED ORDER — OFLOXACIN 0.3 % OP SOLN
5.0000 [drp] | Freq: Every day | OPHTHALMIC | Status: DC
  Administered 2020-05-24: 5 [drp] via OTIC
  Filled 2020-05-24: qty 5

## 2020-05-24 MED ORDER — KETOROLAC TROMETHAMINE 30 MG/ML IJ SOLN
30.0000 mg | Freq: Once | INTRAMUSCULAR | Status: AC
  Administered 2020-05-24: 30 mg via INTRAVENOUS
  Filled 2020-05-24: qty 1

## 2020-05-24 MED ORDER — LIDOCAINE-EPINEPHRINE-TETRACAINE (LET) TOPICAL GEL
3.0000 mL | Freq: Once | TOPICAL | Status: AC
  Administered 2020-05-24: 3 mL via TOPICAL
  Filled 2020-05-24: qty 3

## 2020-05-24 MED ORDER — FENTANYL CITRATE (PF) 100 MCG/2ML IJ SOLN
100.0000 ug | INTRAMUSCULAR | Status: DC | PRN
  Administered 2020-05-24: 100 ug via INTRAVENOUS
  Filled 2020-05-24: qty 2

## 2020-05-24 MED ORDER — PROMETHAZINE HCL 25 MG/ML IJ SOLN
12.5000 mg | Freq: Once | INTRAMUSCULAR | Status: AC
  Administered 2020-05-24: 12.5 mg via INTRAVENOUS
  Filled 2020-05-24: qty 1

## 2020-05-24 MED ORDER — OXYCODONE HCL 5 MG PO TABS: 5.0000 mg | ORAL_TABLET | ORAL | 0 refills | Status: DC | PRN
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
## 2020-05-24 NOTE — ED Notes (Signed)
Ear care performed with sterile water per MD order.

## 2020-06-06 ENCOUNTER — Other Ambulatory Visit: Payer: Self-pay

## 2020-06-06 ENCOUNTER — Ambulatory Visit (INDEPENDENT_AMBULATORY_CARE_PROVIDER_SITE_OTHER): Payer: Federal, State, Local not specified - PPO | Admitting: Psychiatry

## 2020-06-06 DIAGNOSIS — F331 Major depressive disorder, recurrent, moderate: Secondary | ICD-10-CM

## 2020-06-06 DIAGNOSIS — F422 Mixed obsessional thoughts and acts: Secondary | ICD-10-CM

## 2020-06-06 DIAGNOSIS — F4322 Adjustment disorder with anxiety: Secondary | ICD-10-CM

## 2020-06-06 DIAGNOSIS — Z8616 Personal history of COVID-19: Secondary | ICD-10-CM

## 2020-06-06 DIAGNOSIS — S060X9A Concussion with loss of consciousness of unspecified duration, initial encounter: Secondary | ICD-10-CM
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
## 2020-06-23 ENCOUNTER — Encounter: Payer: Self-pay | Admitting: Psychiatry

## 2020-06-23 ENCOUNTER — Other Ambulatory Visit: Payer: Self-pay

## 2020-06-23 ENCOUNTER — Ambulatory Visit (INDEPENDENT_AMBULATORY_CARE_PROVIDER_SITE_OTHER): Payer: Federal, State, Local not specified - PPO | Admitting: Psychiatry

## 2020-06-23 DIAGNOSIS — F5105 Insomnia due to other mental disorder: Secondary | ICD-10-CM | POA: Diagnosis not present

## 2020-06-23 DIAGNOSIS — G43501 Persistent migraine aura without cerebral infarction, not intractable, with status migrainosus: Secondary | ICD-10-CM

## 2020-06-23 DIAGNOSIS — S060X9A Concussion with loss of consciousness of unspecified duration, initial encounter: Secondary | ICD-10-CM

## 2020-06-23 DIAGNOSIS — F40240 Claustrophobia: Secondary | ICD-10-CM

## 2020-06-23 DIAGNOSIS — F331 Major depressive disorder, recurrent, moderate: Secondary | ICD-10-CM | POA: Diagnosis not present

## 2020-06-23 DIAGNOSIS — F4001 Agoraphobia with panic disorder: Secondary | ICD-10-CM

## 2020-06-23 DIAGNOSIS — F422 Mixed obsessional thoughts and acts: Secondary | ICD-10-CM

## 2020-06-23 MED ORDER — CLOMIPRAMINE HCL 50 MG PO CAPS: 150.0000 mg | ORAL_CAPSULE | Freq: Every day | ORAL | 1 refills | Status: DC
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
## 2020-09-22 ENCOUNTER — Other Ambulatory Visit: Payer: Self-pay

## 2020-09-22 ENCOUNTER — Ambulatory Visit (INDEPENDENT_AMBULATORY_CARE_PROVIDER_SITE_OTHER): Payer: Federal, State, Local not specified - PPO | Admitting: Psychiatry

## 2020-09-22 ENCOUNTER — Encounter: Payer: Self-pay | Admitting: Psychiatry

## 2020-09-22 DIAGNOSIS — F5105 Insomnia due to other mental disorder: Secondary | ICD-10-CM

## 2020-09-22 DIAGNOSIS — F40240 Claustrophobia: Secondary | ICD-10-CM

## 2020-09-22 DIAGNOSIS — F422 Mixed obsessional thoughts and acts: Secondary | ICD-10-CM

## 2020-09-22 DIAGNOSIS — F331 Major depressive disorder, recurrent, moderate: Secondary | ICD-10-CM | POA: Diagnosis not present

## 2020-09-22 DIAGNOSIS — F4001 Agoraphobia with panic disorder: Secondary | ICD-10-CM

## 2020-09-22 MED ORDER — QUETIAPINE FUMARATE 200 MG PO TABS: 200.0000 mg | ORAL_TABLET | Freq: Every day | ORAL | 0 refills | Status: DC

## 2020-09-22 MED ORDER — CLONAZEPAM 1 MG PO TABS: 1.0000 mg | ORAL_TABLET | Freq: Four times a day (QID) | ORAL | 1 refills | Status: DC

## 2020-09-22 MED ORDER — MIRTAZAPINE 30 MG PO TABS: 30.0000 mg | ORAL_TABLET | Freq: Every day | ORAL | 1 refills | Status: DC

## 2020-09-22 MED ORDER — CLONIDINE HCL 0.3 MG PO TABS: 0.3000 mg | ORAL_TABLET | Freq: Four times a day (QID) | ORAL | 1 refills | Status: DC
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
## 2020-09-23 ENCOUNTER — Telehealth: Payer: Self-pay | Admitting: Psychiatry

## 2020-09-23 NOTE — Telephone Encounter (Signed)
Yes thanks 

## 2020-09-23 NOTE — Telephone Encounter (Signed)
Did you mean to print the Rx's?

## 2020-09-23 NOTE — Telephone Encounter (Signed)
Pt called and said that the pharmacy didn't receive any of the medicines that were sent. So it looks like they all went to print instead of being sent electronically. Please re submit to [facility] on [address]

## 2020-09-24 ENCOUNTER — Other Ambulatory Visit: Payer: Self-pay | Admitting: Psychiatry

## 2020-09-24 DIAGNOSIS — F5105 Insomnia due to other mental disorder: Secondary | ICD-10-CM

## 2020-09-24 MED ORDER — QUETIAPINE FUMARATE 200 MG PO TABS
200.0000 mg | ORAL_TABLET | Freq: Every day | ORAL | 0 refills | Status: DC
Start: 2020-09-24 — End: 2020-12-22

## 2020-09-24 NOTE — Telephone Encounter (Signed)
Seroquel has 2 sets of instructions, the quantity was # 90. Note mentions 150-200 seroquel at hs.

## 2020-09-24 NOTE — Telephone Encounter (Signed)
Pharmacy called and said that they need clarification on the seroquel. Two sets of directions. Please edit and re submit

## 2020-09-24 NOTE — Telephone Encounter (Signed)
Corrected RX sent

## 2020-12-22 ENCOUNTER — Encounter: Payer: Self-pay | Admitting: Psychiatry

## 2020-12-22 ENCOUNTER — Ambulatory Visit (INDEPENDENT_AMBULATORY_CARE_PROVIDER_SITE_OTHER): Payer: Federal, State, Local not specified - PPO | Admitting: Psychiatry

## 2020-12-22 ENCOUNTER — Other Ambulatory Visit: Payer: Self-pay

## 2020-12-22 DIAGNOSIS — F40240 Claustrophobia: Secondary | ICD-10-CM | POA: Diagnosis not present

## 2020-12-22 DIAGNOSIS — F4001 Agoraphobia with panic disorder: Secondary | ICD-10-CM

## 2020-12-22 DIAGNOSIS — F332 Major depressive disorder, recurrent severe without psychotic features: Secondary | ICD-10-CM | POA: Diagnosis not present

## 2020-12-22 DIAGNOSIS — F422 Mixed obsessional thoughts and acts: Secondary | ICD-10-CM | POA: Diagnosis not present

## 2020-12-22 DIAGNOSIS — G43501 Persistent migraine aura without cerebral infarction, not intractable, with status migrainosus: Secondary | ICD-10-CM

## 2020-12-22 DIAGNOSIS — Z8616 Personal history of COVID-19: Secondary | ICD-10-CM

## 2020-12-22 DIAGNOSIS — F5105 Insomnia due to other mental disorder: Secondary | ICD-10-CM

## 2020-12-22 MED ORDER — QUETIAPINE FUMARATE 300 MG PO TABS: 300.0000 mg | ORAL_TABLET | Freq: Every day | ORAL | 0 refills | Status: DC
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
## 2020-12-22 NOTE — Patient Instructions (Signed)
For depression increase quetiapine to 300 mg nightly or 1 and 1/2 of the 200 mg tablets.

## 2021-01-07 ENCOUNTER — Encounter: Payer: Self-pay | Admitting: Neurology

## 2021-01-15 ENCOUNTER — Other Ambulatory Visit: Payer: Self-pay

## 2021-01-15 ENCOUNTER — Ambulatory Visit (INDEPENDENT_AMBULATORY_CARE_PROVIDER_SITE_OTHER): Payer: Federal, State, Local not specified - PPO | Admitting: Psychiatry

## 2021-01-15 DIAGNOSIS — F40240 Claustrophobia: Secondary | ICD-10-CM

## 2021-01-15 DIAGNOSIS — G43909 Migraine, unspecified, not intractable, without status migrainosus: Secondary | ICD-10-CM | POA: Diagnosis not present

## 2021-01-15 DIAGNOSIS — F4320 Adjustment disorder, unspecified: Secondary | ICD-10-CM

## 2021-01-15 DIAGNOSIS — F422 Mixed obsessional thoughts and acts: Secondary | ICD-10-CM | POA: Diagnosis not present

## 2021-01-15 DIAGNOSIS — Z8616 Personal history of COVID-19: Secondary | ICD-10-CM | POA: Diagnosis not present

## 2021-01-15 DIAGNOSIS — F331 Major depressive disorder, recurrent, moderate: Secondary | ICD-10-CM
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
## 2021-02-11 ENCOUNTER — Ambulatory Visit: Payer: Federal, State, Local not specified - PPO | Attending: Internal Medicine

## 2021-02-11 ENCOUNTER — Other Ambulatory Visit: Payer: Self-pay

## 2021-02-11 DIAGNOSIS — Z23 Encounter for immunization: Secondary | ICD-10-CM

## 2021-02-13 ENCOUNTER — Other Ambulatory Visit (HOSPITAL_BASED_OUTPATIENT_CLINIC_OR_DEPARTMENT_OTHER): Payer: Self-pay

## 2021-02-13 MED ORDER — COVID-19 MRNA VACCINE (PFIZER) 30 MCG/0.3ML IM SUSP
INTRAMUSCULAR | 0 refills | Status: AC
  Filled 2021-02-13: qty 0.3, 1d supply, fill #0

## 2021-02-16 ENCOUNTER — Ambulatory Visit: Payer: Federal, State, Local not specified - PPO | Admitting: Psychiatry

## 2021-02-27 ENCOUNTER — Other Ambulatory Visit: Payer: Self-pay

## 2021-02-27 ENCOUNTER — Ambulatory Visit: Payer: Federal, State, Local not specified - PPO | Admitting: Psychiatry

## 2021-02-27 DIAGNOSIS — Z636 Dependent relative needing care at home: Secondary | ICD-10-CM

## 2021-02-27 DIAGNOSIS — F331 Major depressive disorder, recurrent, moderate: Secondary | ICD-10-CM

## 2021-02-27 DIAGNOSIS — G43909 Migraine, unspecified, not intractable, without status migrainosus: Secondary | ICD-10-CM | POA: Diagnosis not present

## 2021-02-27 DIAGNOSIS — F422 Mixed obsessional thoughts and acts: Secondary | ICD-10-CM

## 2021-02-27 DIAGNOSIS — Z8616 Personal history of COVID-19: Secondary | ICD-10-CM

## 2021-02-27 DIAGNOSIS — Z87820 Personal history of traumatic brain injury: Secondary | ICD-10-CM

## 2021-02-27 DIAGNOSIS — F4322 Adjustment disorder with anxiety: Secondary | ICD-10-CM | POA: Diagnosis not present

## 2021-02-27 DIAGNOSIS — Z638 Other specified problems related to primary support group: Secondary | ICD-10-CM

## 2021-02-27 NOTE — Progress Notes (Signed)
Psychotherapy Progress Note [facility] Harold Moore, PhD LP  Patient ID: Harold Sullivan     MRN: [id number] Therapy format: Individual psychotherapy Date: 02/27/2021      Start: 4:17p     Stop: 5:06p     Time Spent: 49 min Location: In-person   Session narrative (presenting needs, interim history, self-report of stressors and symptoms, applications of prior therapy, status changes, and interventions made in session) 2nd COVID booster, got very achy 2 days, treated with ibuprofen but aggravated stomach.    Wants to explain time away from therapy -- costly to gift his son's marriage (extravagant [place] customs), financial help to niece Harold Sullivan (fellow pt), and going on the [employer] plan (copays become $70-80 after first 10 PCP and specialist visits of the year).  Shows son Harold Sullivan's wedding website and photos, proudly notes son's tech career and fiancee's dermatology career, and overall price tag of $250K for the wedding festivities, all told (his share $125K, $50K of which is help from parents; Harold Sullivan, who remarried a Banker and has 6 houses, the other $125K).  Harold Sullivan is talking about getting married, now, and he asked her to wait 3 years (or join the ceremony for a small amount more).  Until finances worked out, was lying awake worrying, parents worried over him.  Feels blessed that parents can take care of his obligation so much.  $315K house is willed to him (siblings are more independently wealthy).    Now relieved about costs, but apprehensive about seeing Harold Sullivan again.  Through much sidetracking, admits he misses her.  Says he spent 16 months after divorce from Harold Sullivan with fantasy that Harold Sullivan would come back.  Has had warm interaction with her not too long ago, when he was tearful (father is terminal), she held his hand, wiped his brow and told him bygones are bygones.  Support/empathy provided.   Considering sponsoring a (small) [place] refugee family under new  program.  Stray fantasies of it perhaps sparking into a marriage, but clear that he would not try to [employer] work that way.  Reminded how things worked out with [employer] (albeit young, from his home country, and seasoned in manipulation by his later account).  Therapeutic modalities: Cognitive Behavioral Therapy, Solution-Oriented/Positive Psychology and Ego-Supportive  Mental Status/Observations:  Appearance:   Casual     Behavior:  Appropriate  Motor:  Normal  Speech/Language:   Clear and Coherent  Affect:  Appropriate  Mood:  anxious  Thought process:  normal  Thought content:    Obsessions  Sensory/Perceptual disturbances:    WNL  Orientation:  Fully oriented  Attention:  Good    Concentration:  Good  Memory:  WNL  Insight:    Fair  Judgment:   Fair  Impulse Control:  Good   Risk Assessment: Danger to Self: No Self-injurious Behavior: No Danger to Others: No Physical Aggression / Violence: No Duty to Warn: No Access to Firearms a concern: No  Assessment of progress:  stabilized  Diagnosis:   ICD-10-CM   1. Major depressive disorder, recurrent episode, moderate (HCC)  F33.1   2. Adjustment disorder with anxious mood  F43.22   3. OCD - catastrophic responsibility type  F42.2   4. Migraine syndrome  G43.909   5. Relationship problem with family member  Z63.8   25. Caregiver stress  Z63.6   7. History of COVID-19  Z86.16   8. History of concussion  Z87.820    Plan:  . Self-affirm it's  normal to have renewed feelings for ex-wife . If tempted, do not hit on a refugee sponsee . Refocus as able on OCD therapy and resist compulsions ad lib . Other recommendations/advice as may be noted above . Continue to utilize previously learned skills ad lib . Maintain medication as prescribed and work faithfully with relevant prescriber(s) if any changes are desired or seem indicated . Call the clinic on-call service, present to ER, or call 911 if any life-threatening  psychiatric crisis Return 5-6 weeks. . Already scheduled visit in this office 04/07/2021.  Harold Serve, PhD Harold Moore, PhD LP Clinical Psychologist, [facility] [address] [place], [place] [address] [phone]

## 2021-03-15 NOTE — Progress Notes (Signed)
NEUROLOGY CONSULTATION NOTE  Harold Sullivan MRN: [id number] DOB: October 09, 1961  Referring provider: Myriam Jacobson, MD Primary care provider: Myriam Jacobson, MD  Reason for consult:  migraines  Assessment/Plan:   Chronic migraine with aura, without status migrainosus, intractable - at least 15 headache days a month for years - failed multiple preventatives including amitriptyline, topiramate and propranolol - will submit for PA to start Botox  1.  Migraine prevention:  Botox 2.  Migraine rescue:  Will have him try Ubrelvy 100mg .  Otherwise, he has Toradol.  Phenergan for nausea. 3.  Limit use of pain relievers to no more than 2 days out of week to prevent risk of rebound or medication-overuse headache. 4.  Keep headache diary 5.  Follow up for Botox   Subjective:  Harold Sullivan is a 60 year old right-handed male who presents for migraines.  History supplemented by prior neurologist's and referring provider's notes.  Onset:  Since childhood. Location:  Vertex, retro-orbital bilaterally Quality:  Throbbing, pounding Intensity:  Moderate to severe.   Aura:  Scintillations/flashing Prodrome:  Fatigue, head feels heavy Associated symptoms:  Nausea, photophobia, phonophobia, osmophobia, visual disturbance (blurred vision, dark spots, flashing lights, tunnel vision).  He denies associated vomiting, unilateral numbness or weakness. May wake him up from sleep Duration:  3 days Frequency:  5 a month Frequency of abortive medication: Toradol twice a week Triggers:  Emotional stress, change in weather, sleep deprivation, sleeping too much, skipped meals, sex Relieving factors:  rest Activity:  Aggravated by movement  CT head and cervical spine on 05/24/2020 personally reviewed were normal.  Rescue protocol:  Promethazine 50mg  PR, then Toradol inj in 10 minutes Current NSAIDS/analgesics:  Toradol 60mg  inj,Q4-6h PRN oxycodone, Lidocaine patch Current triptans:  none Current ergotamine:   none Current anti-emetic:  Promethazine 50mg  Current muscle relaxants:  Robaxin Current Antihypertensive medications:  clonidine Current Antidepressant medications:  Remeron 30mg  QHS Current Anticonvulsant medications:  none Current anti-CGRP:  none Current Vitamins/Herbal/Supplements:  none Current Antihistamines/Decongestants:  Claritin Other therapy:  none Hormone/birth control:  none Other medications:  Seroquel, clonazepam  Past NSAIDS/analgesics:  Tylenol, ASA, ibuprofen, naproxen, indomethacin, ketoprofen, Toradol, Celebrex, Excedrin, Midrin, codeine, Darvon, Demerol, Dilaudid, Kadian, Stadol, Vioden, Sedapap, Nubain, Esgic, Lidocaine, Sprix Past abortive triptans:  Sumatriptan tab/NS/[place], Maxalt, Zomig, Amerge Past abortive ergotamine:  Migranal, DHE IV/inj Past muscle relaxants:  none Past anti-emetic:  Zofran, Reglan, Phenergan, chlorpromazine Past antihypertensive medications:  Propranolol, atenolol, Corgard, Norvasc Past antidepressant medications:  Amitriptyline, venlafaxine, duloxetine, sertraline, Lexaprol, Celexa, mirtazapine, Paxil, Prozac, Luvox Past anticonvulsant medications:  Topiramate, Depakote, gabapentin, zonisamide Past anti-CGRP:  none Past vitamins/Herbal/Supplements:  none Past antihistamines/decongestants:  Benadryl, Zyrtec, Allegra, Claritin, Nasonex, Flonase, Sudafed, Enfex Other past therapies:  none  Caffeine: 2 cups of tea Diet:  Hydrates, eats little.  Bland, no spicy food. Depression:  yes; Anxiety:  Yes.  History of panic attacks.  Has OCD.  Sees Dr. Clovis Pu (psychiatrist) Sleep hygiene:  Insomnia - takes Remeron which helps. Family history of migraine:  Niece, sister      PAST MEDICAL HISTORY: Past Medical History:  Diagnosis Date  . Anxiety    aslo has OCD  . Claustrophobia   . Claustrophobia   . Depression   . GERD (gastroesophageal reflux disease)   . Headache(784.0)   . Lipoma of abdominal wall 01/30/2013  . Migraine without status  migrainosus, not intractable 09/26/2018  . PONV (postoperative nausea and vomiting)    also has fear of having general anesthesia  PAST SURGICAL HISTORY: Past Surgical History:  Procedure Laterality Date  . LIPOMA EXCISION Bilateral 02/26/2014   Procedure: EXCISION OF SUBCUTAENOUS MASSES RIGHT UPPER ABDOMINAL WALL AND LEFT UPPER ARM;  Surgeon: [name]. Georgette Dover, MD;  Location: [place];  Service: General;  Laterality: Bilateral;  RIGHT UPPER ABDOMINAL WALL, LEFT UPPER ARM, SCALP  . lipoma removal on head  15 yrs ago  . MASS EXCISION Bilateral 02/08/2013   Procedure: Minor Excision Mass Right and Left Side of Abdomen;  Surgeon: Haywood Lasso, MD;  Location: [place];  Service: General;  Laterality: Bilateral;  . MASS EXCISION N/A 02/26/2014   Procedure: EXCISION OF SUBCUTANEOUS MASS OF POSTERIOR SCALP;  Surgeon: [name]. Georgette Dover, MD;  Location: [place];  Service: General;  Laterality: N/A;  . SHOULDER SURGERY Right approx 2011    MEDICATIONS: Current Outpatient Medications on File Prior to Visit  Medication Sig Dispense Refill  . acetaminophen (TYLENOL) 500 MG tablet Take 1 tablet (500 mg total) by mouth every 6 (six) hours as needed. (Patient not taking: Reported on 12/22/2020) 30 tablet 0  . acetic acid-hydrocortisone (VOSOL-HC) OTIC solution Place 3 drops into both ears 3 (three) times daily. (Patient not taking: Reported on 12/22/2020)    . B-D 3CC LUER-LOK SYR 25GX1/2" 25G X 1-1/2" 3 ML MISC     . calcium carbonate (TUMS - DOSED IN MG ELEMENTAL CALCIUM) 500 MG chewable tablet Chew 1 tablet by mouth daily as needed for indigestion.  (Patient not taking: Reported on 12/22/2020)    . chlorproMAZINE (THORAZINE) 10 MG tablet Take 10 mg by mouth at bedtime as needed for hiccoughs, nausea or vomiting.     Marland Kitchen CIALIS 5 MG tablet Take 5 mg by mouth daily as needed for erectile dysfunction.     . clomiPRAMINE (ANAFRANIL) 50 MG capsule Take 3 capsules (150 mg total) by mouth at bedtime.  270 capsule 1  . clonazePAM (KLONOPIN) 1 MG tablet Take 1 tablet (1 mg total) by mouth 4 (four) times daily. 360 tablet 1  . cloNIDine (CATAPRES) 0.3 MG tablet Take 1 tablet (0.3 mg total) by mouth 4 (four) times daily. 360 tablet 1  . COVID-19 mRNA vaccine, Pfizer, 30 MCG/0.3ML injection Inject into the muscle. 0.3 mL 0  . fenofibrate (TRICOR) 145 MG tablet Take 145 mg by mouth daily.    Marland Kitchen ketorolac (TORADOL) 60 MG/2ML SOLN injection Inject 2 mLs into the muscle 2 (two) times daily as needed for migraine.    . lidocaine (LIDODERM) 5 % Place 1 patch onto the skin daily.    Marland Kitchen lidocaine (XYLOCAINE) 5 % ointment Apply 1 application topically daily as needed for mild pain.     Marland Kitchen loratadine (CLARITIN) 10 MG tablet Take 10 mg by mouth daily as needed for allergies.    . metFORMIN (GLUCOPHAGE) 500 MG tablet Take 500 mg by mouth 2 (two) times daily with a meal.     . methocarbamol (ROBAXIN) 500 MG tablet Take 1 tablet (500 mg total) by mouth 4 (four) times daily. 30 tablet 0  . mirtazapine (REMERON) 30 MG tablet Take 1 tablet (30 mg total) by mouth at bedtime. 90 tablet 1  . neomycin-polymyxin-hydrocortisone (CORTISPORIN) 3.5-10000-1 OTIC suspension Place 2-5 drops into both ears 2 (two) times daily.    . ondansetron (ZOFRAN ODT) 4 MG disintegrating tablet Take 1 tablet (4 mg total) by mouth every 8 (eight) hours as needed for nausea or vomiting. 20 tablet 0  . oxyCODONE (ROXICODONE) 5 MG  immediate release tablet Take 1 tablet (5 mg total) by mouth every 4 (four) hours as needed for severe pain. 10 tablet 0  . PROMETHEGAN 50 MG suppository Place 100 mg rectally daily as needed for nausea or vomiting. Take with ketorolac injection.    . QUEtiapine (SEROQUEL) 300 MG tablet Take 1 tablet (300 mg total) by mouth at bedtime. 90 tablet 0  . saccharomyces boulardii (FLORASTOR) 250 MG capsule Take 250 mg by mouth 2 (two) times daily.    . SPRIX 15.75 MG/SPRAY SOLN Apply 1 spray topically daily as needed.     .  Vitamin D, Ergocalciferol, (DRISDOL) 1.25 MG (50000 UNIT) CAPS capsule Take 50,000 Units by mouth once a week.     No current facility-administered medications on file prior to visit.    ALLERGIES: Allergies  Allergen Reactions  . Aspirin     Ringing in ears  . Dilaudid [Hydromorphone Hcl]     Migraines  . Codeine     Upset stomach  . Morphine And Related Nausea And Vomiting    FAMILY HISTORY: Family History  Problem Relation Age of Onset  . Anxiety disorder Sister   . Depression Sister   . Migraines Sister     Objective:  Blood pressure 121/77, pulse 99, height 5\' 9"  (1.753 m), weight 208 lb (94.3 kg), SpO2 95 %. General: No acute distress.  Patient appears well-groomed.   Head:  Normocephalic/atraumatic Eyes:  fundi examined but not visualized Neck: supple, no paraspinal tenderness, full range of motion Back: No paraspinal tenderness Heart: regular rate and rhythm Lungs: Clear to auscultation bilaterally. Vascular: No carotid bruits. Neurological Exam: Mental status: alert and oriented to person, place, and time, recent and remote memory intact, fund of knowledge intact, attention and concentration intact, speech fluent and not dysarthric, language intact. Cranial nerves: CN I: not tested CN II: pupils equal, round and reactive to light, visual fields intact CN III, IV, VI:  full range of motion, no nystagmus, no ptosis CN V: facial sensation intact. CN VII: upper and lower face symmetric CN VIII: hearing intact CN IX, X: gag intact, uvula midline CN XI: sternocleidomastoid and trapezius muscles intact CN XII: tongue midline Bulk & Tone: normal, no fasciculations. Motor:  muscle strength 5/5 throughout Sensation:  Pinprick, temperature and vibratory sensation intact. Deep Tendon Reflexes:  2+ throughout,  toes downgoing.   Finger to nose testing:  Without dysmetria.   Heel to shin:  Without dysmetria.   Gait:  Normal station and stride.  Romberg  negative.    Thank you for allowing me to take part in the care of this patient.  Metta Clines, DO  CC: C Melinda Crutch, MD

## 2021-03-16 ENCOUNTER — Ambulatory Visit (INDEPENDENT_AMBULATORY_CARE_PROVIDER_SITE_OTHER): Payer: Federal, State, Local not specified - PPO | Admitting: Neurology

## 2021-03-16 ENCOUNTER — Other Ambulatory Visit: Payer: Self-pay

## 2021-03-16 ENCOUNTER — Encounter: Payer: Self-pay | Admitting: Neurology

## 2021-03-16 VITALS — BP 121/77 | HR 99 | Ht 69.0 in | Wt 208.0 lb

## 2021-03-16 DIAGNOSIS — G43109 Migraine with aura, not intractable, without status migrainosus: Secondary | ICD-10-CM

## 2021-03-16 NOTE — Patient Instructions (Signed)
  1. Start Botox 2. Take Ubrelvy 100mg  at earliest onset of headache.  May repeat dose once in 2 hours if needed.  Maximum 2 tablets in 24 hours.  If effective, contact me for prescription.  Otherwise, may use Toradol with phenergan 3. Limit use of pain relievers to no more than 2 days out of the week.  These medications include acetaminophen, NSAIDs (ibuprofen/Advil/Motrin, naproxen/Aleve, triptans (Imitrex/sumatriptan), Excedrin, and narcotics.  This will help reduce risk of rebound headaches. 4. Be aware of common food triggers:  - Caffeine:  coffee, black tea, cola, Mt. Dew  - Chocolate  - Dairy:  aged cheeses (brie, blue, cheddar, gouda, [place], provolone, [place], Swiss, etc), chocolate milk, buttermilk, sour cream, limit eggs and yogurt  - Nuts, peanut butter  - Alcohol  - Cereals/grains:  FRESH breads (fresh bagels, sourdough, doughnuts), yeast productions  - Processed/canned/aged/cured meats (pre-packaged deli meats, hotdogs)  - MSG/glutamate:  soy sauce, flavor enhancer, pickled/preserved/marinated foods  - Sweeteners:  aspartame (Equal, Nutrasweet).  Sugar and Splenda are okay  - Vegetables:  legumes (lima beans, lentils, snow peas, fava beans, pinto peans, peas, garbanzo beans), sauerkraut, onions, olives, pickles  - Fruit:  avocados, bananas, citrus fruit (orange, lemon, grapefruit), mango  - Other:  Frozen meals, macaroni and cheese 5. Routine exercise 6. Stay adequately hydrated (aim for 64 oz water daily) 7. Keep headache diary 8. Maintain proper stress management 9. Maintain proper sleep hygiene 10. Do not skip meals 11. Consider supplements:  magnesium citrate 400mg  daily, riboflavin 400mg  daily, coenzyme Q10 100mg  three times daily.

## 2021-04-07 ENCOUNTER — Encounter: Payer: Self-pay | Admitting: Psychiatry

## 2021-04-07 ENCOUNTER — Ambulatory Visit (INDEPENDENT_AMBULATORY_CARE_PROVIDER_SITE_OTHER): Payer: Federal, State, Local not specified - PPO | Admitting: Psychiatry

## 2021-04-07 ENCOUNTER — Other Ambulatory Visit: Payer: Self-pay

## 2021-04-07 DIAGNOSIS — F422 Mixed obsessional thoughts and acts: Secondary | ICD-10-CM | POA: Diagnosis not present

## 2021-04-07 DIAGNOSIS — F4001 Agoraphobia with panic disorder: Secondary | ICD-10-CM | POA: Diagnosis not present

## 2021-04-07 DIAGNOSIS — G43909 Migraine, unspecified, not intractable, without status migrainosus: Secondary | ICD-10-CM

## 2021-04-07 DIAGNOSIS — F5105 Insomnia due to other mental disorder: Secondary | ICD-10-CM

## 2021-04-07 DIAGNOSIS — F331 Major depressive disorder, recurrent, moderate: Secondary | ICD-10-CM | POA: Diagnosis not present

## 2021-04-07 MED ORDER — MIRTAZAPINE 30 MG PO TABS: 30.0000 mg | ORAL_TABLET | Freq: Every day | ORAL | 1 refills | Status: DC

## 2021-04-07 MED ORDER — CLOMIPRAMINE HCL 50 MG PO CAPS: 150.0000 mg | ORAL_CAPSULE | Freq: Every day | ORAL | 1 refills | Status: DC

## 2021-04-07 MED ORDER — CLONIDINE HCL 0.3 MG PO TABS: 0.3000 mg | ORAL_TABLET | Freq: Four times a day (QID) | ORAL | 1 refills | Status: DC

## 2021-04-07 MED ORDER — QUETIAPINE FUMARATE 200 MG PO TABS: 200.0000 mg | ORAL_TABLET | Freq: Every day | ORAL | 1 refills | Status: DC

## 2021-04-07 MED ORDER — CLONAZEPAM 1 MG PO TABS: 1.0000 mg | ORAL_TABLET | Freq: Four times a day (QID) | ORAL | 1 refills | Status: DC
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
## 2021-04-10 ENCOUNTER — Ambulatory Visit (INDEPENDENT_AMBULATORY_CARE_PROVIDER_SITE_OTHER): Payer: Federal, State, Local not specified - PPO | Admitting: Psychiatry

## 2021-04-10 ENCOUNTER — Other Ambulatory Visit: Payer: Self-pay

## 2021-04-10 DIAGNOSIS — F4001 Agoraphobia with panic disorder: Secondary | ICD-10-CM

## 2021-04-10 DIAGNOSIS — F5105 Insomnia due to other mental disorder: Secondary | ICD-10-CM | POA: Diagnosis not present

## 2021-04-10 DIAGNOSIS — F331 Major depressive disorder, recurrent, moderate: Secondary | ICD-10-CM

## 2021-04-10 DIAGNOSIS — F422 Mixed obsessional thoughts and acts: Secondary | ICD-10-CM | POA: Diagnosis not present
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
## 2021-05-13 ENCOUNTER — Other Ambulatory Visit: Payer: Self-pay

## 2021-05-13 ENCOUNTER — Other Ambulatory Visit: Payer: Self-pay | Admitting: Family Medicine

## 2021-05-13 ENCOUNTER — Ambulatory Visit
Admission: RE | Admit: 2021-05-13 | Discharge: 2021-05-13 | Disposition: A | Discharge status: Home / Self Care | Payer: Federal, State, Local not specified - PPO | Source: Ambulatory Visit | Attending: Family Medicine | Admitting: Family Medicine

## 2021-05-13 DIAGNOSIS — Z9289 Personal history of other medical treatment: Secondary | ICD-10-CM
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
## 2021-08-05 ENCOUNTER — Encounter: Payer: Self-pay | Admitting: Psychiatry

## 2021-08-05 ENCOUNTER — Ambulatory Visit: Payer: Federal, State, Local not specified - PPO | Admitting: Psychiatry

## 2021-08-05 ENCOUNTER — Other Ambulatory Visit: Payer: Self-pay

## 2021-08-05 DIAGNOSIS — G43909 Migraine, unspecified, not intractable, without status migrainosus: Secondary | ICD-10-CM

## 2021-08-05 DIAGNOSIS — F4001 Agoraphobia with panic disorder: Secondary | ICD-10-CM

## 2021-08-05 DIAGNOSIS — F331 Major depressive disorder, recurrent, moderate: Secondary | ICD-10-CM | POA: Diagnosis not present

## 2021-08-05 DIAGNOSIS — F40240 Claustrophobia: Secondary | ICD-10-CM

## 2021-08-05 DIAGNOSIS — F5105 Insomnia due to other mental disorder: Secondary | ICD-10-CM

## 2021-08-05 DIAGNOSIS — F422 Mixed obsessional thoughts and acts: Secondary | ICD-10-CM

## 2021-08-05 MED ORDER — MIRTAZAPINE 30 MG PO TABS: 30.0000 mg | ORAL_TABLET | Freq: Every day | ORAL | 1 refills | Status: DC

## 2021-08-05 MED ORDER — QUETIAPINE FUMARATE 200 MG PO TABS: 200.0000 mg | ORAL_TABLET | Freq: Every day | ORAL | 1 refills | Status: DC
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
## 2021-12-10 ENCOUNTER — Ambulatory Visit: Payer: Federal, State, Local not specified - PPO | Admitting: Psychiatry

## 2021-12-10 ENCOUNTER — Other Ambulatory Visit: Payer: Self-pay

## 2021-12-10 ENCOUNTER — Encounter: Payer: Self-pay | Admitting: Psychiatry

## 2021-12-10 DIAGNOSIS — F331 Major depressive disorder, recurrent, moderate: Secondary | ICD-10-CM | POA: Diagnosis not present

## 2021-12-10 DIAGNOSIS — F5105 Insomnia due to other mental disorder: Secondary | ICD-10-CM | POA: Diagnosis not present

## 2021-12-10 DIAGNOSIS — F4001 Agoraphobia with panic disorder: Secondary | ICD-10-CM

## 2021-12-10 DIAGNOSIS — F422 Mixed obsessional thoughts and acts: Secondary | ICD-10-CM

## 2021-12-10 DIAGNOSIS — F40240 Claustrophobia: Secondary | ICD-10-CM

## 2021-12-10 DIAGNOSIS — G43909 Migraine, unspecified, not intractable, without status migrainosus: Secondary | ICD-10-CM

## 2021-12-10 MED ORDER — QUETIAPINE FUMARATE 200 MG PO TABS: 200.0000 mg | ORAL_TABLET | Freq: Every day | ORAL | 1 refills | Status: DC

## 2021-12-10 MED ORDER — CLONIDINE HCL 0.3 MG PO TABS: 0.3000 mg | ORAL_TABLET | Freq: Four times a day (QID) | ORAL | 1 refills | Status: DC

## 2021-12-10 MED ORDER — CLOMIPRAMINE HCL 50 MG PO CAPS: 150.0000 mg | ORAL_CAPSULE | Freq: Every day | ORAL | 1 refills | Status: DC

## 2021-12-10 MED ORDER — MIRTAZAPINE 30 MG PO TABS: 30.0000 mg | ORAL_TABLET | Freq: Every day | ORAL | 1 refills | Status: DC

## 2021-12-10 MED ORDER — CLONAZEPAM 1 MG PO TABS: 1.0000 mg | ORAL_TABLET | Freq: Four times a day (QID) | ORAL | 1 refills | Status: DC
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
## 2021-12-22 ENCOUNTER — Ambulatory Visit (INDEPENDENT_AMBULATORY_CARE_PROVIDER_SITE_OTHER): Payer: Federal, State, Local not specified - PPO | Admitting: Psychiatry

## 2021-12-22 DIAGNOSIS — U071 COVID-19: Secondary | ICD-10-CM

## 2021-12-22 DIAGNOSIS — F422 Mixed obsessional thoughts and acts: Secondary | ICD-10-CM

## 2021-12-22 DIAGNOSIS — Z636 Dependent relative needing care at home: Secondary | ICD-10-CM | POA: Diagnosis not present

## 2021-12-22 DIAGNOSIS — F331 Major depressive disorder, recurrent, moderate: Secondary | ICD-10-CM | POA: Diagnosis not present
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
## 2022-03-11 ENCOUNTER — Ambulatory Visit: Payer: Federal, State, Local not specified - PPO | Admitting: Psychiatry

## 2022-04-20 ENCOUNTER — Ambulatory Visit: Payer: Federal, State, Local not specified - PPO | Admitting: Psychiatry

## 2022-04-20 ENCOUNTER — Encounter: Payer: Self-pay | Admitting: Psychiatry

## 2022-04-20 DIAGNOSIS — F4001 Agoraphobia with panic disorder: Secondary | ICD-10-CM | POA: Diagnosis not present

## 2022-04-20 DIAGNOSIS — F5105 Insomnia due to other mental disorder: Secondary | ICD-10-CM | POA: Diagnosis not present

## 2022-04-20 DIAGNOSIS — F331 Major depressive disorder, recurrent, moderate: Secondary | ICD-10-CM

## 2022-04-20 DIAGNOSIS — F422 Mixed obsessional thoughts and acts: Secondary | ICD-10-CM | POA: Diagnosis not present

## 2022-04-20 DIAGNOSIS — F40240 Claustrophobia: Secondary | ICD-10-CM

## 2022-04-20 DIAGNOSIS — G43909 Migraine, unspecified, not intractable, without status migrainosus: Secondary | ICD-10-CM

## 2022-04-20 MED ORDER — MIRTAZAPINE 30 MG PO TABS: 30.0000 mg | ORAL_TABLET | Freq: Every day | ORAL | 1 refills | Status: DC

## 2022-04-20 MED ORDER — CLOMIPRAMINE HCL 50 MG PO CAPS: 150.0000 mg | ORAL_CAPSULE | Freq: Every day | ORAL | 1 refills | Status: DC

## 2022-04-20 MED ORDER — CLONIDINE HCL 0.3 MG PO TABS: 0.3000 mg | ORAL_TABLET | Freq: Four times a day (QID) | ORAL | 1 refills | Status: DC

## 2022-04-20 MED ORDER — CLONAZEPAM 1 MG PO TABS: 1.0000 mg | ORAL_TABLET | Freq: Four times a day (QID) | ORAL | 1 refills | Status: DC

## 2022-04-20 MED ORDER — QUETIAPINE FUMARATE 200 MG PO TABS: 200.0000 mg | ORAL_TABLET | Freq: Every day | ORAL | 1 refills | Status: DC
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
## 2022-07-21 ENCOUNTER — Ambulatory Visit: Payer: Federal, State, Local not specified - PPO | Admitting: Psychiatry

## 2022-11-12 ENCOUNTER — Other Ambulatory Visit: Payer: Self-pay | Admitting: Psychiatry

## 2022-11-12 DIAGNOSIS — F422 Mixed obsessional thoughts and acts: Secondary | ICD-10-CM

## 2022-11-12 NOTE — Telephone Encounter (Signed)
Please schedule appt

## 2022-11-16 ENCOUNTER — Other Ambulatory Visit: Payer: Self-pay

## 2022-11-16 ENCOUNTER — Ambulatory Visit: Payer: Federal, State, Local not specified - PPO | Admitting: Psychiatry

## 2022-11-16 DIAGNOSIS — F4001 Agoraphobia with panic disorder: Secondary | ICD-10-CM | POA: Diagnosis not present

## 2022-11-16 DIAGNOSIS — F422 Mixed obsessional thoughts and acts: Secondary | ICD-10-CM | POA: Diagnosis not present

## 2022-11-16 DIAGNOSIS — F5105 Insomnia due to other mental disorder: Secondary | ICD-10-CM | POA: Diagnosis not present

## 2022-11-16 DIAGNOSIS — Z87820 Personal history of traumatic brain injury: Secondary | ICD-10-CM

## 2022-11-16 DIAGNOSIS — F331 Major depressive disorder, recurrent, moderate: Secondary | ICD-10-CM | POA: Diagnosis not present

## 2022-11-16 DIAGNOSIS — G43909 Migraine, unspecified, not intractable, without status migrainosus: Secondary | ICD-10-CM

## 2022-11-16 DIAGNOSIS — Z8616 Personal history of COVID-19: Secondary | ICD-10-CM

## 2022-11-16 MED ORDER — CLONAZEPAM 1 MG PO TABS: 1.0000 mg | ORAL_TABLET | Freq: Four times a day (QID) | ORAL | 1 refills | Status: DC

## 2022-11-16 NOTE — Progress Notes (Signed)
Psychotherapy Progress Note [facility] Harold Moore, PhD LP  Patient ID: Harold Sullivan)    MRN: [id number] Therapy format: Individual psychotherapy Date: 11/16/2022      Start: 4:07p     Stop: 4:57p     Time Spent: 50 min Location: In-person   Session narrative (presenting needs, interim history, self-report of stressors and symptoms, applications of prior therapy, status changes, and interventions made in session) Scheduled short notice yesterday.  11 months since last seen.  Explains that he has not been here in months due to cost-cutting on the [facility], which has 10 well-covered visits a year, allegedly exhausted that part,  partly with his COVID case last year.  (Actually, his benefit is 80% copay until $3K OOP, but he felt that was intolerable expense.)  Reports he had a lipoma removed last year, compulsively reports the costs, and how getting combined flu and COVID shot, and then the shingles shot close to it, knocked him down.  Apparently overtaxed his immune system.  Last three months he is free of illness, thankfully, but cautious about going in public places and figures to mask for a good while.  Meanwhile, he had a med supply issue for clonazepam (went past the expiry date for last refill).  Has communicated with Dr. Casimiro Sullivan medical asst today, but pharm says they haven't received.  Verified via EHR chat that it has activated.  While waiting, he apparently went through multiple w/d, with tremors, anxiety, and diarrhea and while traveling to [place] in November.  His friend graciously drove him home 8 hrs home and back to fetch his existing medication, since he could not pick it up away from home.  Reasonably figures he has a clonazepam tolerance, after 12 years or so.  Says he typically takes clonazepam and clonidine together, TID.  Has now learned to keep 7 day supply on his person at all times now.  Encouraged staying clear about his supply before travel,  unless he plans on weaning.  Issue with TV, and world news, especially news of war and civilian deaths.  Says he will develop anxious states for getting absorbed into the story and feeling like he is there.  Also fears of world war if Putin becomes desperate enough.  Affirmed the idea to manage how much news he watches if it will be distressing and be in control of where he spends his attention.  Been involved with niece Harold Sullivan's divorce case, helping her negotiate and make decisions.  Most recently counseled her to drop alimony action against her husband to spare stress, expense, and suspected defeat, even though she (also a patient here) has stated she found evidence of hidden money.  Daughter marrying now, chose a [place] guy, which is no issue for Harold Sullivan, whole family quite happy for her.  Already had a [place] ceremony, at the groom's request, 2 weeks ago, with groom growing a full beard and wearing a turban in respect.  Will do have formal Sikh marriage Mar 2.  Also turning 62 in 3 weeks.  Sees his father, 24, on multiple meds, dealing with a badly enlarged prostate, and urinary complications, and headed for surgery to attenuate blood supply and force shrinkage.  Hopeful of relief, but emotionally he's wrecked.  Pervasively disinterested in activities of life.  PT worries he will face he same fate, since he is genetically related and on similar meds, etc.  Assured him genetics is nor destiny with something like this, and he  could well be long-lived and healthier, or he could pass earlier (his preference) without facing the same things.  Tried to assure that these things are nothing close to fate, that lifestyle, situations, stress management, and treating things earlier can make great differences in aging, but difficult to take in.  Still stuck on the idea that he will be fated to linger in suffering and asks if he can ethically pray to die around 70 instead.  Granted that he can pray anything he  likes, that the God of his shared understanding is open to listening to any prayer; challenged to consider, what if God's answer is to not say right now, but to ask that he trust -- either he can be blessed with death before deterioration, or he could be blessed with better health closer to his father's age, that maybe God asks him to exercise faith that there is more than one "right" answer.  Therapeutic modalities: Cognitive Behavioral Therapy, Solution-Oriented/Positive Psychology, Humanistic/Existential, and Faith-sensitive  Mental Status/Observations:  Appearance:   Casual     Behavior:  Appropriate  Motor:  Normal  Speech/Language:   Clear and Coherent  Affect:  Appropriate  Mood:  anxious and dysthymic  Thought process:  normal  Thought content:    Obsessions  Sensory/Perceptual disturbances:    WNL  Orientation:  Fully oriented  Attention:  Good    Concentration:  Fair  Memory:  WNL  Insight:    Fair  Judgment:   Good  Impulse Control:  Good   Risk Assessment: Danger to Self: No Self-injurious Behavior: No Danger to Others: No Physical Aggression / Violence: No Duty to Warn: No Access to Firearms a concern: No  Assessment of progress:  stabilized  Diagnosis:   ICD-10-CM   1. Major depressive disorder, recurrent episode, moderate (HCC)  F33.1     2. Mixed obsessional thoughts and acts  F42.2     3. Panic disorder with agoraphobia  F40.01     4. Insomnia due to mental condition  F51.05     5. Migraine syndrome  G43.909     6. History of COVID-19  Z86.16     7. History of concussion  Z87.820      Plan:  Suspend judgment about fated illness, practice trust that God can hear prayer and have more "right" answers than the ones we want Maintain good supplies of meds before travel, make sure there is adequate lead time for refills Later consider weaning tolerant meds, esp. Klonopin Continue socially active with family and community Come back to phobia and OCD  challenges when ready Other recommendations/advice as may be noted above Continue to utilize previously learned skills ad lib Maintain medication as prescribed and work faithfully with relevant prescriber(s) if any changes are desired or seem indicated Call the clinic on-call service, 988/hotline, 911, or present to [facility] or ER if any life-threatening psychiatric crisis Return for time at discretion. Already scheduled visit in this office 01/11/2023.  Blanchie Serve, PhD Harold Moore, PhD LP Clinical Psychologist, [facility] [address] [place], [place] [address] [phone]

## 2022-11-19 NOTE — Telephone Encounter (Signed)
Pt is scheduled 01/11/23

## 2023-01-11 ENCOUNTER — Other Ambulatory Visit: Payer: Self-pay | Admitting: Psychiatry

## 2023-01-11 ENCOUNTER — Encounter: Payer: Self-pay | Admitting: Psychiatry

## 2023-01-11 ENCOUNTER — Ambulatory Visit (INDEPENDENT_AMBULATORY_CARE_PROVIDER_SITE_OTHER): Payer: Federal, State, Local not specified - PPO | Admitting: Psychiatry

## 2023-01-11 DIAGNOSIS — F422 Mixed obsessional thoughts and acts: Secondary | ICD-10-CM

## 2023-01-11 DIAGNOSIS — F40240 Claustrophobia: Secondary | ICD-10-CM

## 2023-01-11 DIAGNOSIS — F4001 Agoraphobia with panic disorder: Secondary | ICD-10-CM | POA: Diagnosis not present

## 2023-01-11 DIAGNOSIS — G43909 Migraine, unspecified, not intractable, without status migrainosus: Secondary | ICD-10-CM

## 2023-01-11 DIAGNOSIS — F331 Major depressive disorder, recurrent, moderate: Secondary | ICD-10-CM

## 2023-01-11 DIAGNOSIS — F5105 Insomnia due to other mental disorder: Secondary | ICD-10-CM | POA: Diagnosis not present

## 2023-01-11 MED ORDER — CLONAZEPAM 1 MG PO TABS: 1.0000 mg | ORAL_TABLET | Freq: Four times a day (QID) | ORAL | 1 refills | Status: DC

## 2023-01-11 MED ORDER — MIRTAZAPINE 30 MG PO TABS: 30.0000 mg | ORAL_TABLET | Freq: Every day | ORAL | 1 refills | Status: DC

## 2023-01-11 MED ORDER — QUETIAPINE FUMARATE 200 MG PO TABS: 200.0000 mg | ORAL_TABLET | Freq: Every day | ORAL | 1 refills | Status: DC

## 2023-01-11 MED ORDER — CLONIDINE HCL 0.3 MG PO TABS: 0.3000 mg | ORAL_TABLET | Freq: Four times a day (QID) | ORAL | 1 refills | Status: DC

## 2023-01-11 MED ORDER — CLOMIPRAMINE HCL 50 MG PO CAPS: 150.0000 mg | ORAL_CAPSULE | Freq: Every day | ORAL | 1 refills | Status: DC

## 2023-01-11 NOTE — Patient Instructions (Signed)
[place] 1 in the AM for 2 weeks and if no benefit then increase to 1 in the AM and 1 in the PM
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
## 2023-02-15 ENCOUNTER — Ambulatory Visit: Payer: Federal, State, Local not specified - PPO | Admitting: Psychiatry

## 2023-04-08 ENCOUNTER — Ambulatory Visit: Payer: Federal, State, Local not specified - PPO | Admitting: Psychiatry

## 2023-04-20 ENCOUNTER — Ambulatory Visit: Payer: Federal, State, Local not specified - PPO | Admitting: Psychiatry

## 2023-05-09 ENCOUNTER — Ambulatory Visit (INDEPENDENT_AMBULATORY_CARE_PROVIDER_SITE_OTHER): Payer: Federal, State, Local not specified - PPO | Admitting: Podiatry

## 2023-05-09 VITALS — BP 139/71 | HR 79

## 2023-05-09 DIAGNOSIS — L84 Corns and callosities: Secondary | ICD-10-CM | POA: Diagnosis not present

## 2023-05-09 NOTE — Progress Notes (Signed)
This patient presents to the office to discuss his second toe right foot.  He  says he frequently develops a painful skin lesion on the tip of his second toe right foot. He self treats the skin lesion with salicylic acid.  The skin lesion improved but then returns a few weeks later.  He says his mother had an amputation for similar problem by Dr.  Lajoyce Corners.  He says this is very painful and wants permanent correction to relieve his pain.  Vascular  Dorsalis pedis and posterior tibial pulses are palpable  B/L.  Capillary return  WNL.  Temperature gradient is  WNL.  Skin turgor  WNL  Sensorium  Senn Weinstein monofilament wire  WNL. Normal tactile sensation.  Nail Exam  Patient has normal nails with no evidence of bacterial or fungal infection.  Orthopedic  Exam  Muscle tone and muscle strength  WNL.  No limitations of motion feet  B/L.  No crepitus or joint effusion noted.  Foot type is unremarkable and digits show no abnormalities.  Bony prominences are unremarkable.Elongated second toe right foot.  Skin  No open lesions.  Normal skin texture and turgor.  No evidence of clavi today.  Clavi secondary to elongated second toe right foot.   IE.  Discussed conservative vs. Surgical treatment.  Dispensed toe cap.  Told him to make an appointment with Dr.  Allena Katz for surgical evaluation.  Marland Kitchengam

## 2023-06-01 ENCOUNTER — Ambulatory Visit: Payer: Federal, State, Local not specified - PPO | Admitting: Podiatry

## 2023-08-09 ENCOUNTER — Ambulatory Visit (INDEPENDENT_AMBULATORY_CARE_PROVIDER_SITE_OTHER): Payer: Self-pay | Admitting: Psychiatry

## 2023-08-09 DIAGNOSIS — F40218 Other animal type phobia: Secondary | ICD-10-CM

## 2023-08-09 DIAGNOSIS — F422 Mixed obsessional thoughts and acts: Secondary | ICD-10-CM | POA: Diagnosis not present

## 2023-08-09 DIAGNOSIS — F4001 Agoraphobia with panic disorder: Secondary | ICD-10-CM | POA: Diagnosis not present

## 2023-08-09 DIAGNOSIS — F331 Major depressive disorder, recurrent, moderate: Secondary | ICD-10-CM

## 2023-08-09 DIAGNOSIS — F40240 Claustrophobia: Secondary | ICD-10-CM | POA: Diagnosis not present

## 2023-08-09 DIAGNOSIS — Z62820 Parent-biological child conflict: Secondary | ICD-10-CM

## 2023-08-09 DIAGNOSIS — Z636 Dependent relative needing care at home: Secondary | ICD-10-CM

## 2023-08-09 NOTE — Progress Notes (Signed)
Psychotherapy Progress Note [facility] Marliss Czar, PhD LP  Patient ID: Jarell Mcewen)    MRN: [id number] Therapy format: Individual psychotherapy Date: 08/09/2023      Start: 4:15p     Stop: 5:15p     Time Spent: 61 min Location: In-person   Session narrative (presenting needs, interim history, self-report of stressors and symptoms, applications of prior therapy, status changes, and interventions made in session) Short notice scheduling today.  Still on a rationed care plan where he has 10 OP visits available per year, fully paid, which he has split between primary care, psychiatry, and therapy.  Since last seen, [name] got married and is attending [employer], on course to graduate December.  Still supporting niece, Coralee North (also a patient here), to the tune of $12K for divorce atty expenses.  Son [place] disappointing.  Was persuaded to pull money out of retirement to buy him a car, and the early withdrawal tax penalty is hitting hard.  He is married into a Geneticist, molecular family, but half the cost, and his half split with Lupita Dawn, still amounted to ab0ut 1/8 million.  Regrets having sold his house just before values took off, figures over $250K profit missed out there.  [place] custom to lavish on the young adult marrying, and expect full support to their elders, and trying to ask him to help his sister financially, but he's refusing so far, based on custom.  He does see some savings in health insurance coming a year from now, when [place] ages out.    F is 64 and very depressed, M broke femur, too, and his S in [place] has taken them in.  Damek remains in their house, goes to [place] weekly on Thursdays overnight now to take F to ECT.  Seeing it has Aquarius interested in ECT himself, will talk with Dr. Jennelle Human.  Overall content with his current medication regime to maintain, but still has intrusive thoughts of OCD, some intrusive depressed mood, various phobias.  Still rates  social phobia, claustrophobia, dog phobia active concerns, wiling to work on these with further time, just still locked in on the idea that he cannot use mor than his 10 covered OP visits a year on th insurance plan he chose two years ago.  Encouraged to consider, though he still plans to stay with the rationed-care plan next year.   Since last seen, he has traveled to [place] and [place], with only one more incident of being mistaken for a terrorist (same name and DOB as a terror watch list person, despite at least 15 years of documented government clearance).  Finally an advance over the old system where agents had to look past the first page of the computer system to find out he is not their man; when last detained, at [facility] airport, he put up with detention then shrewdly asked for a letter documenting the occasion, and made an appal to [employer], which was acted on very quickly and he is now issued a Engineer, building services, which appears on his airline tickets and will enable virtually instantaneous disambiguation at airports.  Has more trouble coming up, and he has a controlled substances letter he needs updated from Dr. Jennelle Human.  Begins to ask [place] for help getting that done, redirected to staff and Dr. Jennelle Human whom he will see tomorrow.  PT's plan now for therapy is to schedule in December and try to schedule quarterly next year.  Encouraged to consider his goals, which may include  improved relaxation and coping skills, in vivo elevator exposure, or other furthering of anxiety coping among his various phobias.  Affirms that he is still afraid of elevators but has managed to shrink his fear of dogs to those whom he does not know the owner and trust they have kept up with rabies vaccinations.  (Origin of this fear is in childhood experience in [place], where he witnessed the treatment for rabies to be merely watching 2 weeks to see if the patient dies.)  No mention today of obsessive rituals around medicine and  ensuring he does the safe and correct thing, no mention of paranoia or perceived need for a weapon, and presumed the impact on his life at this point of his anxiety disorder has most to do with persistent worry, care of loved ones, and the fundamental loneliness of his living situation.  Affirmed and encouraged.  Therapeutic modalities: Cognitive Behavioral Therapy, Solution-Oriented/Positive Psychology, and Ego-Supportive  Mental Status/Observations:  Appearance:   Casual     Behavior:  Appropriate  Motor:  Normal  Speech/Language:   Clear and Coherent  Affect:  Appropriate  Mood:  anxious and responsive  Thought process:  normal  Thought content:    Obsessions and milder  Sensory/Perceptual disturbances:    WNL  Orientation:  Fully oriented  Attention:  Good    Concentration:  Good  Memory:  WNL  Insight:    Variable  Judgment:   Good  Impulse Control:  Good   Risk Assessment: Danger to Self: No Self-injurious Behavior: No Danger to Others: No Physical Aggression / Violence: No Duty to Warn: No Access to Firearms a concern: No  Assessment of progress:  stabilized  Diagnosis:   ICD-10-CM   1. Major depressive disorder, recurrent episode, moderate (HCC)  F33.1     2. Mixed obsessional thoughts and acts  F42.2     3. Panic disorder with agoraphobia  F40.01     4. Claustrophobia  F40.240     5. 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 Caregiver stress  Z63.6     7. Relationship problem between parent and child  Z62.820      Plan:  OCD and phobias -- Continue ad lib exposure wherever possible Intrusive worries -- Seek to recognize inflated concerns, suspend judgment, and take perceived chances Travel considerations -- See psychiatry for updated travel letter about controlled substances.  Maintain adequate supplies of medications for travel and make sure there is adequate lead time for refills. Medication considerations -- consider weaning of tolerant meds, especially Klonopin, but  most likely will need a sufficiently active behavioral program to strengthen his own coping first Mood and morale - Continue to be reasonably socially active in his community, and in caregiving with family.  Continue to work [employer] with ex-wife on parenting issues with their emerging adult children.  Continue to work constructively with sister on parents' care. Other recommendations/advice -- As may be noted above.  Continue to utilize previously learned skills ad lib. Medication compliance -- Maintain medication as prescribed and work faithfully with relevant prescriber(s) if any changes are desired or seem indicated. Crisis service -- Aware of call list and work-in appts.  Call the clinic on-call service, 988/hotline, 911, or present to [facility] or ER if any life-threatening psychiatric crisis. Followup -- Return in about 2 months (around 10/09/2023) for time as available.  Next scheduled visit with me Visit date not found.  Next scheduled in this office 08/10/2023.  Robley Fries, PhD  Marliss Czar, PhD LP Clinical Psychologist, [facility] [address] [place], [place] [address] [phone]

## 2023-08-10 ENCOUNTER — Ambulatory Visit (INDEPENDENT_AMBULATORY_CARE_PROVIDER_SITE_OTHER): Payer: Self-pay | Admitting: Psychiatry

## 2023-08-10 ENCOUNTER — Encounter: Payer: Self-pay | Admitting: Psychiatry

## 2023-08-10 DIAGNOSIS — F40240 Claustrophobia: Secondary | ICD-10-CM

## 2023-08-10 DIAGNOSIS — F5105 Insomnia due to other mental disorder: Secondary | ICD-10-CM

## 2023-08-10 DIAGNOSIS — F331 Major depressive disorder, recurrent, moderate: Secondary | ICD-10-CM | POA: Diagnosis not present

## 2023-08-10 DIAGNOSIS — F422 Mixed obsessional thoughts and acts: Secondary | ICD-10-CM | POA: Diagnosis not present

## 2023-08-10 DIAGNOSIS — F4001 Agoraphobia with panic disorder: Secondary | ICD-10-CM

## 2023-08-10 DIAGNOSIS — G43909 Migraine, unspecified, not intractable, without status migrainosus: Secondary | ICD-10-CM

## 2023-08-10 MED ORDER — CLONIDINE HCL 0.3 MG PO TABS: 0.3000 mg | ORAL_TABLET | Freq: Four times a day (QID) | ORAL | 1 refills | Status: DC

## 2023-08-10 MED ORDER — MIRTAZAPINE 30 MG PO TABS: 30.0000 mg | ORAL_TABLET | Freq: Every day | ORAL | 1 refills | Status: DC

## 2023-08-10 MED ORDER — QUETIAPINE FUMARATE 200 MG PO TABS: 200.0000 mg | ORAL_TABLET | Freq: Every day | ORAL | 1 refills | Status: DC

## 2023-08-10 MED ORDER — CLONAZEPAM 1 MG PO TABS: 1.0000 mg | ORAL_TABLET | Freq: Four times a day (QID) | ORAL | 1 refills | Status: DC

## 2023-08-10 MED ORDER — CLOMIPRAMINE HCL 50 MG PO CAPS: 150.0000 mg | ORAL_CAPSULE | Freq: Every day | ORAL | 1 refills | Status: DC
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
## 2023-10-31 ENCOUNTER — Ambulatory Visit: Payer: Federal, State, Local not specified - PPO | Admitting: Psychiatry

## 2024-01-18 ENCOUNTER — Ambulatory Visit: Admitting: Psychiatry

## 2024-01-18 ENCOUNTER — Encounter: Payer: Self-pay | Admitting: Psychiatry

## 2024-01-18 DIAGNOSIS — F422 Mixed obsessional thoughts and acts: Secondary | ICD-10-CM

## 2024-01-18 DIAGNOSIS — F4320 Adjustment disorder, unspecified: Secondary | ICD-10-CM

## 2024-01-18 DIAGNOSIS — F331 Major depressive disorder, recurrent, moderate: Secondary | ICD-10-CM

## 2024-01-18 DIAGNOSIS — G43909 Migraine, unspecified, not intractable, without status migrainosus: Secondary | ICD-10-CM

## 2024-01-18 DIAGNOSIS — Z636 Dependent relative needing care at home: Secondary | ICD-10-CM

## 2024-01-18 DIAGNOSIS — F40240 Claustrophobia: Secondary | ICD-10-CM

## 2024-01-18 DIAGNOSIS — F4001 Agoraphobia with panic disorder: Secondary | ICD-10-CM

## 2024-01-18 DIAGNOSIS — F5105 Insomnia due to other mental disorder: Secondary | ICD-10-CM

## 2024-01-18 MED ORDER — CLONIDINE HCL 0.3 MG PO TABS: 0.3000 mg | ORAL_TABLET | Freq: Four times a day (QID) | ORAL | 1 refills | Status: DC

## 2024-01-18 MED ORDER — MIRTAZAPINE 30 MG PO TABS
30.0000 mg | ORAL_TABLET | Freq: Every day | ORAL | 1 refills | Status: DC
Start: 2024-01-18 — End: 2024-06-21

## 2024-01-18 MED ORDER — CLOMIPRAMINE HCL 50 MG PO CAPS: 150.0000 mg | ORAL_CAPSULE | Freq: Every day | ORAL | 1 refills | Status: DC

## 2024-01-18 MED ORDER — CLONAZEPAM 1 MG PO TABS: 1.0000 mg | ORAL_TABLET | Freq: Four times a day (QID) | ORAL | 1 refills | Status: DC

## 2024-01-18 MED ORDER — QUETIAPINE FUMARATE 200 MG PO TABS: 200.0000 mg | ORAL_TABLET | Freq: Every day | ORAL | 1 refills | Status: DC

## 2024-01-18 NOTE — Progress Notes (Signed)
 Psychotherapy Progress Note [facility] Jodie Kendall, PhD LP  Patient ID: Harold [id number])    MRN: [id number] Therapy format: Individual psychotherapy Date: 01/18/2024      Start: 1:06p     Stop: 1:56p     Time Spent: 50 min Location: In-person   Session narrative (presenting needs, interim history, self-report of stressors and symptoms, applications of prior therapy, status changes, and interventions made in session) Continues to schedule sporadically, months apart.  Added on today, in advance of a med check with Dr. Geoffry.  Recently back from months away in [place], [place], and [place].  Took in Muslim, [place] Hindu, and Buddhist faith practices, among other things.  Reveals he got married to a 61yo woman in [place] he had been secretly dating 6 months, married in [place] Hindu tradition.  Shares pictures of the rituals they participated in.  Still secret from everyone but mother, and now [place].    Mentions the new federal administration and its emphasis on deportations and immigration controls.  Because of it, he is not even bothering yet to file an application for her, figuring it will just not get looked at in the current climate.  Encouraged to call anyway, find out what procedures and conditions are, if they have them figured out, or consult an immigration attorney about planning.  Plan right now is for him to be here a couple months then travel to [place] again to see her.  Support/validation provided.   Father is in poorer health, now in [place] with sister.  Siblings also on extended stays to help take care of him.  F continues to see JONELLE Aurora, MD for pharmacotherapy for phobias, OCD, and depression, but it does little good.  Had to disable his doorbell to prevent alarming him.   Seeing him lose his appetite, figure he is < 6 mos to live at this point.  Support/validation provided.   Re. his chronic problem of being detained at airports and borders, Connell had been  given a federal DHS redress number to fast-track clearance; despite that, he was still detained at the [place] airport for apparent failure to log his number into the [facility] system.  Hopefully it's addressed now, but can't be sure.  Support/validation provided.   Re. phobias, had an issue with a foreign air carrier and the lavatory, with claustrophobia.  Needed to use it but could not abide locking the door, required his new wife to stand by.  While that worked out [employer], it was more awkward when he traveled alone back to the [place]  and asked a flight attendant to stand by.  Tried to talk to the head attendant when the first one didn't understand claustrophobia, and he wound up getting ignored.  Understandably awkward, but he feels it was ignorant on their part.  With no sentry, he managed his fear (of being both intruded on and locked in) by holding the door with one hand.  Says he has been able to start taking elevators recently, so long as he is equipped with a bag that has water, sedatives, and a phone, in case of getting stuck.    Brainstormed a compromise for air travel -- make a sign for himself to indicate the lavatory is occupied, then he can leave it unlocked if he wishes and hopefully prevent intrusion.  To assuage fear of locking the door, looked up in session what flight attendants can do to free a locked door from the outside if needed, and  found it is very straightforward to do.  Encouraged on that basis to consider locking it and imagining how the rescue would happen if he somehow was unable to unlock.  Encouraged to practice imaginally, since he will not have frequent opportunity to try it out in real life.  Therapeutic modalities: Cognitive Behavioral Therapy, Solution-Oriented/Positive Psychology, and Ego-Supportive  Mental Status/Observations:  Appearance:   Casual     Behavior:  Appropriate  Motor:  Normal  Speech/Language:   Clear and Coherent  Affect:  Appropriate  Mood:  anxious   Thought process:  circumstantial  Thought content:    Obsessions  Sensory/Perceptual disturbances:    WNL  Orientation:  Fully oriented  Attention:  Good    Concentration:  Good  Memory:  WNL  Insight:    Fair  Judgment:   Variable  Impulse Control:  Good   Risk Assessment: Danger to Self: No Self-injurious Behavior: No Danger to Others: No Physical Aggression / Violence: No Duty to Warn: No Access to Firearms a concern: No  Assessment of progress:  progressing  Diagnosis:   ICD-10-CM   1. Major depressive disorder, recurrent episode, moderate (HCC)  F33.1     2. Mixed obsessional thoughts and acts  F42.2     3. Claustrophobia  F40.240     4. Caregiver stress  Z63.6     5. Anticipatory grief  F43.20      Plan:  OCD and intrusive worries -- Continue ad lib exposure wherever possible and lessening checking and picture taking to verify things were done safely.  Seek to recognize inflated concerns, suspend judgment, and take perceived chances wherever available, and approach it as strengthening, not as an unwanted ordeal. Claustrophobia -- For elevators, repeat entering and riding with his survival pack, and noticing normal operation.  When ready, begin dismantling the survival package and riding anyway.  For airplane lavatory, consider a sign (to alleviate need for lock or sentry) and get imaginal practice confronting being locked in with imagining flight attendants unlocking via the safety mechanism.   Travel considerations -- See psychiatry for updated travel letter about controlled substances.  Maintain adequate supplies of medications for travel and make sure there is adequate lead time for refills. Medication considerations -- consider weaning of tolerant meds, especially Klonopin , but most likely will need a sufficiently active behavioral program to strengthen his own coping first Mood and morale - Continue to be reasonably socially active in his community, and in caregiving  with family.  Continue to work [employer] with ex-wife on parenting issues with their emerging adult children.  Continue to work constructively with sister on parents' care. Other recommendations/advice -- As may be noted above.  Continue to utilize previously learned skills ad lib. Medication compliance -- Maintain medication as prescribed and work faithfully with relevant prescriber(s) if any changes are desired or seem indicated. Crisis service -- Aware of call list and work-in appts.  Call the clinic on-call service, 988/hotline, 911, or present to [facility] or ER if any life-threatening psychiatric crisis. Followup -- Return for time as already scheduled.  Next scheduled visit with me 03/06/2024.  Next scheduled in this office 01/18/2024.  Lamar Kendall, PhD Jodie Kendall, PhD LP Clinical Psychologist, [facility] [address], [address] [place], [place] [address] [phone]
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
## 2024-02-08 ENCOUNTER — Telehealth: Payer: Self-pay

## 2024-02-08 NOTE — Telephone Encounter (Signed)
 Received a PA request for Clonazepam 1 mg tabs #360 for 90 days, submitted electronically with [facility] but will have to contact them at [phone]

## 2024-02-08 NOTE — Telephone Encounter (Signed)
 Contacted [facility] and representative started a new PA in CMM, explained I already tried that but she wanted to try and send that way again. Tried submitting and same response came back. Printed out [employer] PA forms and will try faxing.

## 2024-02-20 NOTE — Telephone Encounter (Signed)
 Prior approval received effective 11/16/23-02/13/25 for quantity limit on Clonazepam with [employer] FEP program

## 2024-03-06 ENCOUNTER — Ambulatory Visit: Admitting: Psychiatry

## 2024-03-22 ENCOUNTER — Ambulatory Visit: Admitting: Psychiatry

## 2024-04-19 ENCOUNTER — Ambulatory Visit: Admitting: Psychiatry

## 2024-04-26 ENCOUNTER — Ambulatory Visit: Admitting: Psychiatry

## 2024-06-21 ENCOUNTER — Encounter: Payer: Self-pay | Admitting: Psychiatry

## 2024-06-21 ENCOUNTER — Ambulatory Visit: Admitting: Psychiatry

## 2024-06-21 DIAGNOSIS — I10 Essential (primary) hypertension: Secondary | ICD-10-CM

## 2024-06-21 DIAGNOSIS — F331 Major depressive disorder, recurrent, moderate: Secondary | ICD-10-CM

## 2024-06-21 DIAGNOSIS — F40240 Claustrophobia: Secondary | ICD-10-CM | POA: Diagnosis not present

## 2024-06-21 DIAGNOSIS — F5105 Insomnia due to other mental disorder: Secondary | ICD-10-CM

## 2024-06-21 DIAGNOSIS — F422 Mixed obsessional thoughts and acts: Secondary | ICD-10-CM | POA: Diagnosis not present

## 2024-06-21 DIAGNOSIS — F4001 Agoraphobia with panic disorder: Secondary | ICD-10-CM

## 2024-06-21 DIAGNOSIS — G43909 Migraine, unspecified, not intractable, without status migrainosus: Secondary | ICD-10-CM

## 2024-06-21 MED ORDER — CLONIDINE HCL 0.3 MG PO TABS: 0.3000 mg | ORAL_TABLET | Freq: Four times a day (QID) | ORAL | 0 refills | Status: DC

## 2024-06-21 MED ORDER — CLONAZEPAM 1 MG PO TABS: 1.0000 mg | ORAL_TABLET | Freq: Four times a day (QID) | ORAL | 1 refills | Status: AC

## 2024-06-21 MED ORDER — CLONIDINE 0.2 MG/24HR TD PTWK
0.2000 mg | MEDICATED_PATCH | TRANSDERMAL | 0 refills | Status: DC
Start: 2024-06-21 — End: 2024-06-26

## 2024-06-21 MED ORDER — MIRTAZAPINE 30 MG PO TABS: 30.0000 mg | ORAL_TABLET | Freq: Every day | ORAL | 1 refills | Status: AC

## 2024-06-21 MED ORDER — CLOMIPRAMINE HCL 50 MG PO CAPS: 150.0000 mg | ORAL_CAPSULE | Freq: Every day | ORAL | 1 refills | Status: AC

## 2024-06-21 MED ORDER — QUETIAPINE FUMARATE 200 MG PO TABS: 200.0000 mg | ORAL_TABLET | Freq: Every day | ORAL | 1 refills | Status: AC

## 2024-06-21 NOTE — Patient Instructions (Addendum)
 Monitor BP daily while switching clonidine  tablets to patch. Start clonidine  patch. After 1 day then reduce clonidine  by cutting 1 tablet in half every day for 5 days then call the office to report your blood pressure (or respond to the MyChart message).
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
## 2024-06-26 ENCOUNTER — Other Ambulatory Visit: Payer: Self-pay | Admitting: Psychiatry

## 2024-06-26 ENCOUNTER — Ambulatory Visit (INDEPENDENT_AMBULATORY_CARE_PROVIDER_SITE_OTHER): Admitting: Psychiatry

## 2024-06-26 DIAGNOSIS — F4001 Agoraphobia with panic disorder: Secondary | ICD-10-CM | POA: Diagnosis not present

## 2024-06-26 DIAGNOSIS — F422 Mixed obsessional thoughts and acts: Secondary | ICD-10-CM | POA: Diagnosis not present

## 2024-06-26 DIAGNOSIS — F40240 Claustrophobia: Secondary | ICD-10-CM

## 2024-06-26 DIAGNOSIS — Z634 Disappearance and death of family member: Secondary | ICD-10-CM

## 2024-06-26 DIAGNOSIS — F331 Major depressive disorder, recurrent, moderate: Secondary | ICD-10-CM

## 2024-06-26 DIAGNOSIS — I10 Essential (primary) hypertension: Secondary | ICD-10-CM

## 2024-06-26 MED ORDER — CLONIDINE 0.2 MG/24HR TD PTWK: 0.2000 mg | MEDICATED_PATCH | TRANSDERMAL | 0 refills | Status: DC

## 2024-06-26 NOTE — Progress Notes (Signed)
 Psychotherapy Progress Note [facility] Jodie Kendall, PhD LP  Patient ID: Cederick Broadnax)    MRN: [id number] Therapy format: Individual psychotherapy Date: 06/26/2024      Start: 2:03p     Stop: 2:51p     Time Spent: 48 min Location: In-person   Session narrative (presenting needs, interim history, self-report of stressors and symptoms, applications of prior therapy, status changes, and interventions made in session) Scheduled short notice this week.  Was having poorly controlled BP, saw PCP, took a clonidine  before going over, normal BP.  Last week saw Dr. Geoffry, began clonidine  patch -- which he shows off to Dr. JAYSON in the hall today, and proudly tells him is working strongly enough to stop the pills.  Feeling off today, but happy with those results.  Affirmed the find and encouraged in appropriate use and applying the benefit to E/RP where able.    Evidence he is starting to obsess about transdermal technology now and conjuring new fears of contamination.  Asks about swimming, whether water can absorb through the skin, and chlorine with it.  Unable to answer definitively, but suggested that the thing about transdermal absorption is probably that it matters how large a molecule is whether it passes the skin.  Advised to trust that the skin is smarter than that, and if any does absorb, it is slow and inconsequential.  Father was cremated and interred in [place], but requested the vessel back.  This differs with [place] custom to dispose of all possessions of the deceased, using some of his clothes and shoes.  Wanted to retain something.  3 months on, it's hard to fathom him gone, at least after spending 2 months on 24/7 watch with him, then bending over his deathbed for an hour.  Cries sometimes, never in front of anyone.  Repeatedly claims his mind is not accepting, but clearly in speech he is not in denial, it's just painful.    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  Interesting side story of dealership soliciting him to trade it in for $43k, or sell for $33k.  Blessed also to have F will his house in retirement, worth $4.12M, in [place], to him, where he lives now.  M lives with S in [place], and is suspected to live out her life there.  Certainly stabilizes finances, and caregiving load has abated.  Turning attention to life and lifestyle beyond caregiving and death watch.  Reports lonely life these days, just 1 friend to see.  Comfortable home alone, more bothered by public spaces and crowds, actually.  Only shops [employer] and other stores just before closing time, to minimize exposure to people.    Flew to [place] for a month, saw W Deveron, who still lives in [place].  Unable to immigrate yet, so plans remain to visit abroad until able.  Concern with [place]  govt changes as regard her immigration and in fact the safety of his people.  Saw it happen in his [place] community [place], [place]) an ICE raid on [employer], which dragged several of his people without due process or access to their phones.  Since that story, he is carrying his passport wallet card now at all times, and figures he may pretend to be [place] (been mistaken before) and maybe mention to ICE officers that I have your uncle [Trump] 's letter (passport).  Advised against trying to play cute with them if in that position, but understood trying  to lighten his own feelings.  Unfortunately, his new passport also means that his redress number, granted under the previous administration, was purged from the Homeland Security/TSA system.  Often enough, he gets mistaken for Latinos from [place], [place], and elsewhere.  Acknowledges it is all unfair, and a form of harassment and racial profiling, but he is confident he won't get into such trouble as others, just have another day wasted, as he has at borders before.  Says he has been proud to be American the last 40 years, but it is another grief the  profiling and racism going on now, that now begins to threaten his own peace, too.  Relate a recent incident where Markez and brother were followed closely for 25 minutes by a sheriff near [place], for which they felt they had to plan self-protective measures.  Understood to have profiled brother's turban.  Condolences offered, and encouragement to mention their story to lawmakers if so moved.    Therapeutic modalities: Cognitive Behavioral Therapy and Solution-Oriented/Positive Psychology  Mental Status/Observations:  Appearance:   Casual     Behavior:  Appropriate  Motor:  Normal  Speech/Language:   Clear and Coherent  Affect:  Appropriate  Mood:  anxious and less  Thought process:  normal, somewhat circumstantial  Thought content:    obsessions  Sensory/Perceptual disturbances:    WNL  Orientation:  Fully oriented  Attention:  Good    Concentration:  Good  Memory:  WNL  Insight:    Variable  Judgment:   Good  Impulse Control:  Good   Risk Assessment: Danger to Self: No Self-injurious Behavior: No Danger to Others: No Physical Aggression / Violence: No Duty to Warn: No Access to Firearms a concern: No  Assessment of progress:  progressing  Diagnosis:   ICD-10-CM   1. Major depressive disorder, recurrent episode, moderate (HCC)  F33.1     2. Mixed obsessional thoughts and acts  F42.2     3. Claustrophobia  F40.240     4. Panic disorder with agoraphobia  F40.01     5. Bereavement  Z63.4      Plan:  OCD and intrusive worries -- Continue ad lib exposure wherever possible and lessening checking and picture taking to verify things were done safely.  Seek to recognize inflated concerns, suspend judgment, and take perceived chances wherever available, and approach it as strengthening, not as an unwanted ordeal. Claustrophobia -- For elevators, repeat entering and riding with his survival pack, and noticing normal operation.  When ready, begin dismantling the survival package and  riding anyway.  For airplane lavatory, consider a sign (to alleviate need for lock or sentry) and get imaginal practice confronting being locked in with imagining flight attendants unlocking via the safety mechanism.   Travel considerations -- See psychiatry for updated travel letter about controlled substances.  Maintain adequate supplies of medications for travel and make sure there is adequate lead time for refills. Medication considerations -- consider weaning of tolerant meds, especially Klonopin , but most likely will need a sufficiently active behavioral program to strengthen his own coping first Mood and morale - Continue to be reasonably socially active in his community, and in caregiving with family.  Continue to work [employer] with ex-wife on parenting issues with their emerging adult children.  Continue to work constructively with sister on parents' care. Other recommendations/advice -- As may be noted above.  Continue to utilize previously learned skills ad lib. Medication compliance -- Maintain medication as prescribed and work faithfully  with relevant prescriber(s) if any changes are desired or seem indicated. Crisis service -- Aware of call list and work-in appts.  Call the clinic on-call service, 988/hotline, 911, or present to [facility] or ER if any life-threatening psychiatric crisis. Followup -- Return for time as already scheduled.  Next scheduled visit with me 07/19/2024.  Next scheduled in this office 07/19/2024.  Lamar Kendall, PhD Jodie Kendall, PhD LP Clinical Psychologist, [facility] [address], [address] [place], [place] [address] [phone]

## 2024-07-18 ENCOUNTER — Telehealth: Payer: Self-pay | Admitting: Psychiatry

## 2024-07-18 NOTE — Telephone Encounter (Signed)
 Pt started on clonidine  patch last month. Reporting he is using his last patch. He wants to increase dose to 0.3 mg, using 1 tablet with the patch. He is asking for 90-day supply of both. BP using 0.3 mg 135-142/84.

## 2024-07-18 NOTE — Telephone Encounter (Signed)
 Pt came in office reporting he is to advise if Clonidine  0.2 mg is not enough. Provider would increase to 0.3 mg for 90 day supply. [facility] [address] Contact pt @ [phone] if needed

## 2024-07-19 ENCOUNTER — Ambulatory Visit: Admitting: Psychiatry

## 2024-07-19 ENCOUNTER — Other Ambulatory Visit: Payer: Self-pay | Admitting: Psychiatry

## 2024-07-19 DIAGNOSIS — I10 Essential (primary) hypertension: Secondary | ICD-10-CM

## 2024-07-19 DIAGNOSIS — F4001 Agoraphobia with panic disorder: Secondary | ICD-10-CM

## 2024-07-19 DIAGNOSIS — F422 Mixed obsessional thoughts and acts: Secondary | ICD-10-CM

## 2024-07-19 MED ORDER — CLONIDINE 0.3 MG/24HR TD PTWK: 0.3000 mg | MEDICATED_PATCH | TRANSDERMAL | 0 refills | Status: AC

## 2024-07-19 NOTE — Telephone Encounter (Signed)
 I'm ok with sending patch at higher dose.  I do not understand why he needs the tablets also.  Try the patch first at a higher dose.  We should not add tablets to it without clearly establishing he tolerates the patch

## 2024-07-20 NOTE — Telephone Encounter (Signed)
 Patient notified of Rx and recommendations.

## 2024-08-06 ENCOUNTER — Ambulatory Visit: Admitting: Psychiatry

## 2024-08-21 ENCOUNTER — Emergency Department (HOSPITAL_BASED_OUTPATIENT_CLINIC_OR_DEPARTMENT_OTHER)
Admission: EM | Admit: 2024-08-21 | Discharge: 2024-08-21 | Disposition: A | Discharge status: Home / Self Care | Attending: Emergency Medicine | Admitting: Emergency Medicine

## 2024-08-21 ENCOUNTER — Encounter (HOSPITAL_BASED_OUTPATIENT_CLINIC_OR_DEPARTMENT_OTHER): Payer: Self-pay | Admitting: Emergency Medicine

## 2024-08-21 ENCOUNTER — Other Ambulatory Visit: Payer: Self-pay

## 2024-08-21 DIAGNOSIS — G8918 Other acute postprocedural pain: Secondary | ICD-10-CM | POA: Insufficient documentation

## 2024-08-21 MED ORDER — HYDROMORPHONE HCL 1 MG/ML IJ SOLN
1.0000 mg | Freq: Once | INTRAMUSCULAR | Status: AC
  Administered 2024-08-21: 1 mg via INTRAVENOUS
  Filled 2024-08-21: qty 1

## 2024-08-21 MED ORDER — OXYCODONE HCL 5 MG PO TABS
10.0000 mg | ORAL_TABLET | Freq: Once | ORAL | Status: AC
  Administered 2024-08-21: 10 mg via ORAL
  Filled 2024-08-21: qty 2

## 2024-08-21 MED ORDER — KETOROLAC TROMETHAMINE 30 MG/ML IJ SOLN
30.0000 mg | Freq: Once | INTRAMUSCULAR | Status: AC
Start: 2024-08-21 — End: 2024-08-21
  Administered 2024-08-21: 30 mg via INTRAVENOUS
  Filled 2024-08-21: qty 1

## 2024-08-21 MED ORDER — SENNOSIDES-DOCUSATE SODIUM 8.6-50 MG PO TABS: 1.0000 | ORAL_TABLET | Freq: Every evening | ORAL | 0 refills | Status: AC | PRN

## 2024-08-21 MED ORDER — OXYCODONE HCL 5 MG PO TABS: 5.0000 mg | ORAL_TABLET | Freq: Four times a day (QID) | ORAL | 0 refills | Status: AC | PRN

## 2024-08-21 MED ORDER — ONDANSETRON HCL 4 MG/2ML IJ SOLN
4.0000 mg | Freq: Once | INTRAMUSCULAR | Status: AC
  Administered 2024-08-21: 4 mg via INTRAVENOUS
  Filled 2024-08-21: qty 2

## 2024-08-21 NOTE — ED Provider Notes (Signed)
 Emergency Department Provider Note   I have reviewed the triage vital signs and the nursing notes.   HISTORY  Chief Complaint Post-op Problem   HPI Harold Sullivan is a 63 y.o. male with past history reviewed below presents on postop day 1 status post ORIF of left thumb crush injury/fracture with Dr. Shari.  He felt well immediately after surgery but pain has progressed significantly since 9 PM.  No known bleeding or discharge.  He has been taking Tylenol  and ibuprofen without relief.  No other stronger pain medications prescribed at time of discharge.  His pain is sharp and 10 out of 10 in severity.    Past Medical History:  Diagnosis Date   Anxiety    aslo has OCD   Claustrophobia    Claustrophobia    Depression    GERD (gastroesophageal reflux disease)    Headache(784.0)    Lipoma of abdominal wall 01/30/2013   Migraine without status migrainosus, not intractable 09/26/2018   PONV (postoperative nausea and vomiting)    also has fear of having general anesthesia    Review of Systems  Constitutional: No fever/chills Cardiovascular: Denies chest pain. Respiratory: Denies shortness of breath. Musculoskeletal: Positive left thumb pain.  Skin: Negative for rash. Neurological: Negative for numbness.  ____________________________________________   PHYSICAL EXAM:  VITAL SIGNS: ED Triage Vitals  Encounter Vitals Group     BP 08/21/24 0027 (!) 157/75     Pulse Rate 08/21/24 0027 78     Resp 08/21/24 0027 16     Temp 08/21/24 0027 98 F (36.7 C)     Temp Source 08/21/24 0027 Oral     SpO2 08/21/24 0027 98 %     Weight 08/21/24 0027 208 lb (94.3 kg)     Height 08/21/24 0027 5' 9 (1.753 m)   Constitutional: Alert and oriented. Well appearing and in no acute distress. Eyes: Conjunctivae are normal.  Head: Atraumatic. Nose: No congestion/rhinnorhea. Mouth/Throat: Mucous membranes are moist.  Neck: No stridor.   Cardiovascular: Normal rate, regular rhythm. Good  peripheral circulation.  Respiratory: Normal respiratory effort. Gastrointestinal: No distention.  Musculoskeletal: Dressing on the left thumb is clean and dry.  Neurologic:  Normal speech and language.  Skin:  Skin is warm, dry and intact. No rash noted.   ____________________________________________   PROCEDURES  Procedure(s) performed:   Procedures  None  ____________________________________________   INITIAL IMPRESSION / ASSESSMENT AND PLAN / ED COURSE  Pertinent labs & imaging results that were available during my care of the patient were reviewed by me and considered in my medical decision making (see chart for details).   This patient is Presenting for Evaluation of thumb pain, which does require a range of treatment options, and is a complaint that involves a moderate risk of morbidity and mortality.  The Differential Diagnoses include post-op pain, infection, post-op complication, etc.  Critical Interventions-    Medications  oxyCODONE  (Oxy IR/ROXICODONE ) immediate release tablet 10 mg (10 mg Oral Given 08/21/24 0048)  HYDROmorphone  (DILAUDID ) injection 1 mg (1 mg Intravenous Given 08/21/24 0159)  ondansetron  (ZOFRAN ) injection 4 mg (4 mg Intravenous Given 08/21/24 0157)  ketorolac  (TORADOL ) 30 MG/ML injection 30 mg (30 mg Intravenous Given 08/21/24 0204)  HYDROmorphone  (DILAUDID ) injection 1 mg (1 mg Intravenous Given 08/21/24 0253)    Reassessment after intervention: pain improved.    I did obtain Additional Historical Information from sister at bedside.  Medical Decision Making: Summary:  Patient presents the emergency department with acute onset postoperative  pain.  Patient has been taking over-the-counter medications without relief.  The dressing is well-appearing and there is no apparent bleeding or drainage on my assessment.  I suspect that after anesthesia and likely nerve block wore off his pain became severe.  We have controlled that well here.  He  plans to follow-up closely with his orthopedist tomorrow.  I have reviewed the PDMP and called in prescription for oxycodone .   Patient's presentation is most consistent with acute, uncomplicated illness.   Disposition: discharge  ____________________________________________  FINAL CLINICAL IMPRESSION(S) / ED DIAGNOSES  Final diagnoses:  Post-operative pain     NEW OUTPATIENT MEDICATIONS STARTED DURING THIS VISIT:  New Prescriptions   OXYCODONE  (ROXICODONE ) 5 MG IMMEDIATE RELEASE TABLET    Take 1 tablet (5 mg total) by mouth every 6 (six) hours as needed for severe pain (pain score 7-10).   SENNA-DOCUSATE (SENOKOT-S) 8.6-50 MG TABLET    Take 1 tablet by mouth at bedtime as needed for mild constipation.    Note:  This document was prepared using Dragon voice recognition software and may include unintentional dictation errors.  Fonda Law, MD, [facility]    Emonii Wienke, Fonda MATSU, MD 08/21/24 [phone]

## 2024-08-21 NOTE — Discharge Instructions (Signed)
 We have treated your postoperative pain here in the emergency department.  I have sent over some stronger medicines for pain to your pharmacy.  Please take as directed.  This will cause constipation and can cause dependency.  Please only take as needed for severe breakthrough pain.  Do not drink alcohol or drive a car while taking this medicine.  Please call for close follow-up with the orthopedic doctors.

## 2024-08-21 NOTE — ED Triage Notes (Signed)
 Pt via POV c/o severe postoperative pain to left hand. He smashed his thumb with a hammer on Friday and had surgery today at [facility]. He was discharged around 2pm and felt fine until a little after 9pm, then his pain became unbearable and was not improved at all after taking prescribed pain medications and tylenol  and ibuprofen. Pain 10/10 and sharp

## 2024-08-21 NOTE — ED Notes (Signed)
 Pt crushed his thumb with hammer on Friday, sx yesterday Intense throbbing pain since 2030 last night

## 2024-08-30 ENCOUNTER — Ambulatory Visit: Admitting: Psychiatry

## 2024-08-30 ENCOUNTER — Encounter: Payer: Self-pay | Admitting: Psychiatry

## 2024-08-30 DIAGNOSIS — F40240 Claustrophobia: Secondary | ICD-10-CM

## 2024-08-30 DIAGNOSIS — F331 Major depressive disorder, recurrent, moderate: Secondary | ICD-10-CM

## 2024-08-30 DIAGNOSIS — F422 Mixed obsessional thoughts and acts: Secondary | ICD-10-CM

## 2024-08-30 DIAGNOSIS — G43909 Migraine, unspecified, not intractable, without status migrainosus: Secondary | ICD-10-CM

## 2024-08-30 DIAGNOSIS — F4001 Agoraphobia with panic disorder: Secondary | ICD-10-CM

## 2024-08-30 DIAGNOSIS — F5105 Insomnia due to other mental disorder: Secondary | ICD-10-CM
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
## 2024-09-24 ENCOUNTER — Ambulatory Visit: Attending: Cardiology | Admitting: Cardiology

## 2024-09-24 ENCOUNTER — Encounter: Payer: Self-pay | Admitting: Cardiology

## 2024-09-24 VITALS — BP 113/62 | HR 75 | Ht 69.0 in | Wt 215.0 lb

## 2024-09-24 DIAGNOSIS — Z789 Other specified health status: Secondary | ICD-10-CM | POA: Diagnosis not present

## 2024-09-24 DIAGNOSIS — I10 Essential (primary) hypertension: Secondary | ICD-10-CM | POA: Diagnosis not present

## 2024-09-24 DIAGNOSIS — E782 Mixed hyperlipidemia: Secondary | ICD-10-CM | POA: Insufficient documentation

## 2024-09-24 NOTE — Patient Instructions (Signed)
  Lab Work: Fasting lipid panel in 3 months   If you have labs (blood work) drawn today and your tests are completely normal, you will receive your results only by: MyChart Message (if you have MyChart) OR A paper copy in the mail If you have any lab test that is abnormal or we need to change your treatment, we will call you to review the results.  Testing/Procedures: Calcium score   CT scanning for a cardiac calcium score (CAT scanning), is a noninvasive, special x-ray that produces cross-sectional images of the body using x-rays and a computer. CT scans help physicians diagnose and treat medical conditions. For some CT exams, a contrast material is used to enhance visibility in the area of the body being studied. CT scans provide greater clarity and reveal more details than regular x-ray exams. Your physician has requested that you have a coronary calcium score performed. This is not covered by insurance and will be an out-of-pocket cost of approximately $99.   REFERRAL TO PHARM-D   Follow-Up: At [facility], you and your health needs are our priority.  As part of our continuing mission to provide you with exceptional heart care, our providers are all part of one team.  This team includes your primary Cardiologist (physician) and Advanced Practice Providers or APPs (Physician Assistants and Nurse Practitioners) who all work together to provide you with the care you need, when you need it.  Your next appointment:   3 month(s)  Provider:   Newman JINNY Lawrence, MD

## 2024-09-24 NOTE — Progress Notes (Signed)
 Cardiology Office Note:  .   Date:  09/24/2024  ID:  Harold Sullivan, DOB February 21, 1961, MRN [id number] PCP: Harold Carlin Redbird, MD  [facility] Providers Cardiologist:  Newman Lawrence, MD PCP: Harold Carlin Redbird, MD  Chief Complaint  Patient presents with   Hyperlipidemia     Harold Sullivan is a 63 y.o. male with hypertension, hyperlipidemia, type 2 diabetes mellitus, family history of CAD  Discussed the use of AI scribe software for clinical note transcription with the patient, who gave verbal consent to proceed.  History of Present Illness Harold Sullivan is a 63 year old male with hypertension and high cholesterol who presents for a follow-up on blood pressure management and cardiovascular risk assessment. He was referred by Dr. Okey for a physical examination and evaluation of his cardiovascular health.  Hypertension was significantly elevated at 169-170 mmHg four months ago. Initial treatment with losartan and olmesartan was ineffective. Clonidine , prescribed by his psychiatrist, now controls his blood pressure, which is stable at 120/80 mmHg with a pulse of 70-80 bpm. Current regimen includes clonidine  0.3 mg four times daily and a 0.3 mg patch.  High cholesterol and triglycerides are attributed to family history. His father had high cholesterol but no heart problems, while his grandfather and two great-uncles died of heart attacks. He has not used statins due to migraines with rosuvastatin and is open to injectable options.  Diabetes is managed with metformin 500 mg twice daily. He maintains an active lifestyle, frequently traveling and engaging in physical activities like walking on the beach for two hours. Pulse increases with exertion, but there is no chest pain or shortness of breath.  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      Vitals:   09/24/24  0951  BP: 113/62  Pulse: 75  SpO2: 98%      Review of Systems  Cardiovascular:  Negative for chest pain, dyspnea on exertion, leg swelling, palpitations and syncope.        Studies Reviewed: SABRA        EKG 09/24/2024: Normal sinus rhythm Normal ECG When compared with ECG of 25-Nov-2019 08:37, No significant change was found    Labs 08/2024: Chol 248, TG 354, HDL 41, LDL 142 HbA1C 6.6% Hb 13.1 Cr 1.15, Na 133   Physical Exam Vitals and nursing note reviewed.  Constitutional:      General: He is not in acute distress. Neck:     Vascular: No JVD.  Cardiovascular:     Rate and Rhythm: Normal rate and regular rhythm.     Heart sounds: Normal heart sounds. No murmur heard. Pulmonary:     Effort: Pulmonary effort is normal.     Breath sounds: Normal breath sounds. No wheezing or rales.  Musculoskeletal:     Right lower leg: No edema.     Left lower leg: No edema.      VISIT DIAGNOSES:   ICD-10-CM   1. Primary hypertension  I10 EKG 12-Lead    2. Mixed hyperlipidemia  E78.2 EKG 12-Lead    CT CARDIAC SCORING (SELF PAY ONLY)    AMB Referral to [facility]    Lipid panel    CANCELED: Lipid panel    3. Statin intolerance  Z78.9 AMB Referral to [facility]       Kush Jiles is a 63 y.o. male with hypertension, hyperlipidemia, type 2 diabetes mellitus, family  history of CAD  Assessment & Plan Mixed hyperlipidemia: High cholesterol and triglycerides. Family history of heart disease. Previous intolerance to rosuvastatin due to migraines. Open to injectable medications. - Ordered calcium score scan to assess coronary artery calcium. - Referred to lipid clinic for evaluation and potential initiation of Repatha. - Repeat fasting lipid panel in three months after return from [place]. - Discussed diet low in simple carbohydrate and high saturated fat. - Fortunately no angina symptoms at this time.  Primary hypertension: Hypertension well-controlled  with current regimen. Blood pressure stable at 120/80 mmHg. - Continue current antihypertensive regimen including clonidine  patch and oral clonidine .  Type 2 diabetes mellitus: Diabetes well-controlled with metformin. - Continue metformin 500 mg twice daily.       F/u in 3 months  Signed, Newman JINNY Lawrence, MD

## 2024-09-30 NOTE — Progress Notes (Unsigned)
 Patient ID: Harold Sullivan                 DOB: 07-06-1961                    MRN: [id number]      HPI: Crawford Tamura is a 63 y.o. male patient referred to lipid clinic by Dr. Elmira. PMH is significant for T2DM and HTN. At last visit, planned on checking his lipid levels following trip to [place].   Last lipid panel: TC 248, TG 354, HDL 41, LDL 142 (08/2024). Patient noted family history of high cholesterol with his grandfather having experienced a heart attack and states for all of his siblings have high cholesterol. States family members have total cholesterol running in 200s and that all of his siblings are on medication for elevated cholesterol. Has multiple failed attempts to use statin for lowering cholesterol and has been unable to tolerate due to migraines with use. Last trial occurred last week.   Patient has extensive migraine history. Cannot tolerate many oral medications, including Tylenol , Advil, vitamins, OTC supplements like Omega3, and rosuvastatin due to migraines from the medication. Reports he tolerates injection and non-oral options well for management of migraines. Uses ketorlac injections and promethazine  suppository for migraine onset. Comfortable with injection technique and has done '>1000 times.' Because of extensive intolerance to PO medications patient is interested in injectable options. Patient tries to avoid known triggers such as large crowds and loud noises but has difficulty doing so.   Patient reports restrictive diet due to migraine history. Eats fish 2 times per week. Encouraged him to increase fish intake to help lower triglyceride levels. Also instructed patient to limit intake of carbohydrates through rice and white bread to help with triglycerides.   Reviewed options for lowering LDL cholesterol, including ezetimibe and PCSK-9 inhibitors.  Discussed mechanisms of action, dosing, side effects and potential decreases in LDL cholesterol. Also reviewed cost  information and potential options for patient assistance. Based on discussion patient is most interested in trial of Repatha  for LDL lowering. Demonstrated to patient administration technique for Repatha .   Educated patient on the role of LDL in plaque build-up and risk of heart attack and stroke. Patient stated he will be getting CAC score done shortly, discussed purpose of test with him.   Patient is retired and enjoys traveling. Will be traveling from start of 2026 to March 28th. Owns second home in [place] where he will be spending much of this time. Because of travel plans requested 90 day fills for any new medications. Patient has traveled with refrigerated medications before without trouble and has access to ice pack he can use. Motivated to make a change before leaving on vacation to help get cholesterol controlled.   Current Medications: n/a Intolerances: Rosuvastatin 20 mg (migraines). Multiple failed trials.  Risk Factors: Family history, HTN, T2DM.  LDL goal: <70   Diet: Very restrictive diet. Does not eat any processed foods. Diet is well controlled. Eats grillled fish two times per week.   Exercise: Walks for ~1.5 hours, but has goal for 3 hours. Feels good when he exercises and feels this helps with migraines.   Family History:  Family History  Problem Relation Age of Onset   Anxiety disorder Sister    Depression Sister    Migraines Sister      Social History: Does not drink any alcohol or smoke. Even avoids medications with alcohol such as NyQuil. States any amount of alcohol has  strong effect on him.   Labs:  Lipid Panel     Component Value Date/Time   TRIG 348 (H) 11/25/2019 0709    Past Medical History:  Diagnosis Date   Anxiety    aslo has OCD   Claustrophobia    Claustrophobia    Depression    GERD (gastroesophageal reflux disease)    Headache(784.0)    Lipoma of abdominal wall 01/30/2013   Migraine without status migrainosus, not intractable  09/26/2018   PONV (postoperative nausea and vomiting)    also has fear of having general anesthesia    Current Outpatient Medications on File Prior to Visit  Medication Sig Dispense Refill   acetaminophen  (TYLENOL ) 500 MG tablet Take 1 tablet (500 mg total) by mouth every 6 (six) hours as needed. 30 tablet 0   B-D 3CC LUER-LOK SYR 25GX1/2 25G X 1-1/2 3 ML MISC      chlorproMAZINE (THORAZINE) 10 MG tablet Take 10 mg by mouth at bedtime as needed for hiccoughs, nausea or vomiting.     CIALIS 5 MG tablet Take 5 mg by mouth daily as needed for erectile dysfunction.      clomiPRAMINE  (ANAFRANIL ) 50 MG capsule Take 3 capsules (150 mg total) by mouth at bedtime. 270 capsule 1   clonazePAM  (KLONOPIN ) 1 MG tablet Take 1 tablet (1 mg total) by mouth 4 (four) times daily. 360 tablet 1   cloNIDine  (CATAPRES  - DOSED IN MG/24 HR) 0.3 mg/24hr patch Place 1 patch (0.3 mg total) onto the skin once a week. 90 patch 0   COVID-19 mRNA vaccine, Pfizer, 30 MCG/0.3ML injection Inject into the muscle. 0.3 mL 0   fenofibrate (TRICOR) 145 MG tablet Take 145 mg by mouth daily.     ketorolac  (TORADOL ) 60 MG/2ML SOLN injection Inject 2 mLs into the muscle 2 (two) times daily as needed for migraine.     lidocaine  (LIDODERM ) 5 % Place 1 patch onto the skin daily.     lidocaine  (XYLOCAINE ) 5 % ointment Apply 1 application topically daily as needed for mild pain.      loratadine (CLARITIN) 10 MG tablet Take 10 mg by mouth daily as needed for allergies.     losartan (COZAAR) 50 MG tablet Take 50 mg by mouth daily.     metFORMIN (GLUCOPHAGE) 500 MG tablet Take 500 mg by mouth 2 (two) times daily with a meal.      methocarbamol  (ROBAXIN ) 500 MG tablet Take 1 tablet (500 mg total) by mouth 4 (four) times daily. 30 tablet 0   mirtazapine  (REMERON ) 30 MG tablet Take 1 tablet (30 mg total) by mouth at bedtime. 90 tablet 1   olmesartan (BENICAR) 40 MG tablet Take 40 mg by mouth daily.     ondansetron  (ZOFRAN  ODT) 4 MG  disintegrating tablet Take 1 tablet (4 mg total) by mouth every 8 (eight) hours as needed for nausea or vomiting. 20 tablet 0   oxyCODONE  (ROXICODONE ) 5 MG immediate release tablet Take 1 tablet (5 mg total) by mouth every 6 (six) hours as needed for severe pain (pain score 7-10). 15 tablet 0   PROMETHEGAN 50 MG suppository Place 100 mg rectally daily as needed for nausea or vomiting. Take with ketorolac  injection.     QUEtiapine  (SEROQUEL ) 200 MG tablet Take 1 tablet (200 mg total) by mouth at bedtime. 90 tablet 1   saccharomyces boulardii (FLORASTOR) 250 MG capsule Take 250 mg by mouth 2 (two) times daily.     senna-docusate (SENOKOT-S) 8.6-50  MG tablet Take 1 tablet by mouth at bedtime as needed for mild constipation. 20 tablet 0   SPRIX  15.75 MG/SPRAY SOLN Apply 1 spray topically daily as needed.     Vitamin D, Ergocalciferol, (DRISDOL) 1.25 MG (50000 UNIT) CAPS capsule Take 50,000 Units by mouth once a week.     No current facility-administered medications on file prior to visit.    Allergies  Allergen Reactions   Aspirin     Ringing in ears   Codeine     Upset stomach   Morphine  And Codeine Nausea And Vomiting    Assessment/Plan:  1. Hyperlipidemia -  Problem  Mixed Hyperlipidemia   Last lipid panel: TC 248, TG 354, HDL 41, LDL 142 (08/2024). Has multiple failed attempts to use statin for lowering cholesterol and has been unable to tolerate due to migraines with use. Last trial occurred last week.   Current Medications: n/a Intolerances: Rosuvastatin 20 mg (migraines). Multiple failed trials.  Risk Factors: Family history, HTN, T2DM.  LDL goal: <70     Mixed hyperlipidemia Assessment:  LDL goal: < 70 mg/dl last LDLc 857 mg/dl (89/7974) Intolerance to statins and many oral medications due to migraines Discussed next potential options (PCSK-9 inhibitors, bempedoic acid and inclisiran); cost, dosing efficacy, side effects   Plan: Will apply for PA for PCSK9i; will inform  patient upon approval (prefers MyChart message) Lipid lab due in 2-3 months after starting PCSK9i   Thank you,  Ashyia Schraeder, Pharm.D Candidate   Robbi Blanch, [address]D [place] [facility]. [facility] [address], [place], [place] [address] Phone: [phone]; Fax: [phone]

## 2024-10-01 ENCOUNTER — Telehealth: Payer: Self-pay | Admitting: Pharmacist

## 2024-10-01 ENCOUNTER — Encounter: Payer: Self-pay | Admitting: Pharmacist

## 2024-10-01 ENCOUNTER — Other Ambulatory Visit (HOSPITAL_COMMUNITY): Payer: Self-pay

## 2024-10-01 ENCOUNTER — Ambulatory Visit: Attending: Cardiology | Admitting: Pharmacist

## 2024-10-01 DIAGNOSIS — E782 Mixed hyperlipidemia: Secondary | ICD-10-CM

## 2024-10-01 NOTE — Patient Instructions (Signed)
 Your Results:             Your most recent labs Goal  Total Cholesterol 248 < 200  Triglycerides 354 < 150  HDL (happy/good cholesterol) 41 > 40  LDL (lousy/bad cholesterol 142 < 70   Medication changes:  We will start the process to get PCSK9i (Repatha  or Praluent)  covered by your insurance.  Once the prior authorization is complete, we will call you to let you know and confirm pharmacy information.    Praluent is a cholesterol medication that improved your body's ability to get rid of bad cholesterol known as LDL. It can lower your LDL up to 60%. It is an injection that is given under the skin every 2 weeks. The most common side effects of Praluent include runny nose, symptoms of the common cold, rarely flu or flu-like symptoms, back/muscle pain in about 3-4% of the patients, and redness, pain, or bruising at the injection site.    Repatha  is a cholesterol medication that improved your body's ability to get rid of bad cholesterol known as LDL. It can lower your LDL up to 60%! It is an injection that is given under the skin every 2 weeks. The medication often requires a prior authorization from your insurance company. We will take care of submitting all the necessary information to your insurance company to get it approved. The most common side effects of Repatha  include runny nose, symptoms of the common cold, rarely flu or flu-like symptoms, back/muscle pain in about 3-4% of the patients, and redness, pain, or bruising at the injection site.   Lab orders: We want to repeat labs after 2-3 months.  We will send you a lab order to remind you once we get closer to that time.

## 2024-10-01 NOTE — Telephone Encounter (Addendum)
 Pharmacy Patient Advocate Encounter   Received notification from Physician's Office that prior authorization for REPATHA  is required/requested.   Insurance verification completed.   The patient is insured through [employer].   Per test claim: PA required; PA submitted to above mentioned insurance via FAX Key/confirmation #/[id number] Status is pending

## 2024-10-01 NOTE — Assessment & Plan Note (Signed)
 Assessment:  LDL goal: < 70 mg/dl last LDLc 857 mg/dl (89/7974) Intolerance to statins and many oral medications due to migraines Discussed next potential options (PCSK-9 inhibitors, bempedoic acid and inclisiran); cost, dosing efficacy, side effects   Plan: Will apply for PA for PCSK9i; will inform patient upon approval (prefers MyChart message) Lipid lab due in 2-3 months after starting PCSK9i

## 2024-10-02 ENCOUNTER — Ambulatory Visit (HOSPITAL_COMMUNITY)
Admission: RE | Admit: 2024-10-02 | Discharge: 2024-10-02 | Disposition: A | Discharge status: Home / Self Care | Payer: Self-pay | Source: Ambulatory Visit | Attending: Cardiology | Admitting: Cardiology

## 2024-10-02 DIAGNOSIS — E782 Mixed hyperlipidemia: Secondary | ICD-10-CM | POA: Insufficient documentation

## 2024-10-02 MED ORDER — REPATHA SURECLICK 140 MG/ML ~~LOC~~ SOAJ: 140.0000 mg | SUBCUTANEOUS | 3 refills | Status: AC

## 2024-10-02 NOTE — Telephone Encounter (Signed)
 Patient came in to inform he received call from his insurance that his PA approved so wants us  [facility]. Prescription sent and lab ordered for end of April  2026 as he will be out of country till late March

## 2024-10-03 ENCOUNTER — Ambulatory Visit: Payer: Self-pay | Admitting: Cardiology

## 2024-10-03 DIAGNOSIS — I2510 Atherosclerotic heart disease of native coronary artery without angina pectoris: Secondary | ICD-10-CM

## 2024-10-03 NOTE — H&P (View-Only) (Signed)
 Multivessel calcification noted.  If no bleeding issues, recommend aspirin 81 mg daily.  Continue Repatha .  In addition, given high level of calcification, recommend exercise nuclear stress test to unmask any symptoms and severe ischemia.  Patient is scheduled to leave for abroad soon.  While this test is not urgent, we could consider doing it before his overseas trip. 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  Thanks MJP

## 2024-10-03 NOTE — Progress Notes (Signed)
 Multivessel calcification noted.  If no bleeding issues, recommend aspirin 81 mg daily.  Continue Repatha .  In addition, given high level of calcification, recommend exercise nuclear stress test to unmask any symptoms and severe ischemia.  Patient is scheduled to leave for abroad soon.  While this test is not urgent, we could consider doing it before his overseas trip. 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  Thanks MJP

## 2024-10-08 ENCOUNTER — Telehealth: Payer: Self-pay | Admitting: Cardiology

## 2024-10-08 DIAGNOSIS — R9439 Abnormal result of other cardiovascular function study: Secondary | ICD-10-CM

## 2024-10-08 DIAGNOSIS — Z01812 Encounter for preprocedural laboratory examination: Secondary | ICD-10-CM

## 2024-10-08 NOTE — Telephone Encounter (Signed)
 Pt came in office to req an order for nuclear stress test to be dropped per Patwardhan. Pt is traveling abroad soon & this test was suggested. Please advise.Thank you.

## 2024-10-08 NOTE — Telephone Encounter (Signed)
 Pt called in stating he would like to have test ordered.

## 2024-10-08 NOTE — Telephone Encounter (Signed)
 Addressed 10/08/24 by B. Banker

## 2024-10-09 ENCOUNTER — Inpatient Hospital Stay (HOSPITAL_COMMUNITY): Admission: RE | Admit: 2024-10-09 | Discharge: 2024-10-09 | Attending: Cardiology

## 2024-10-09 ENCOUNTER — Other Ambulatory Visit: Payer: Self-pay | Admitting: Cardiology

## 2024-10-09 DIAGNOSIS — I2510 Atherosclerotic heart disease of native coronary artery without angina pectoris: Secondary | ICD-10-CM | POA: Insufficient documentation

## 2024-10-09 LAB — MYOCARDIAL PERFUSION IMAGING
Angina Index: 0
Duke Treadmill Score: -1
Estimated workload: 10.1
Exercise duration (min): 9 min
Exercise duration (sec): 0 s
LV dias vol: 125 mL (ref 62–150)
LV sys vol: 50 mL (ref 4.2–5.8)
MPHR: 159 {beats}/min
Nuc Stress EF: 60 %
Peak HR: 148 {beats}/min
Percent HR: 93 %
Rest HR: 67 {beats}/min
Rest Nuclear Isotope Dose: 10.1 mCi
SDS: 2
SRS: 2
SSS: 3
ST Depression (mm): 2 mm
Stress Nuclear Isotope Dose: 31.7 mCi
TID: 1.11

## 2024-10-09 MED ORDER — TECHNETIUM TC 99M TETROFOSMIN IV KIT
10.1000 | PACK | Freq: Once | INTRAVENOUS | Status: AC | PRN
  Administered 2024-10-09: 10.1 via INTRAVENOUS

## 2024-10-09 MED ORDER — TECHNETIUM TC 99M TETROFOSMIN IV KIT
31.7000 | PACK | Freq: Once | INTRAVENOUS | Status: AC | PRN
  Administered 2024-10-09: 31.7 via INTRAVENOUS

## 2024-10-10 NOTE — Telephone Encounter (Signed)
 Looks like it recently resulted.

## 2024-10-10 NOTE — Telephone Encounter (Addendum)
 S/w the patient- went over the results and recommendations. I explained that Dr Pat's next available day is 10/23/24. He said that he would like it to be done as soon as possible and it is ok if it is with another provider.   Got the procedure scheduled for 10/17/24 at 10 am with Dr Wendel. Went over all of the information below with the patient. Sent instructions over MyChart. Answered all questions. He verbalized understanding of all information. He will come to the lab at [facility] tomorrow at 11 am.   Please discuss the below with the patient: I have reviewed the risks, indications, and alternatives to cardiac catheterization, possible angioplasty, and stenting with the patient. Risks include but are not limited to bleeding, infection, vascular injury, stroke, myocardial infection, arrhythmia, kidney injury, radiation-related injury in the case of prolonged fluoroscopy use, emergency cardiac surgery, and death. The patient understands the risks of serious complication is 1-2 in 1000 with diagnostic cardiac cath and 1-2% or less with angioplasty/stenting.    Thanks MJP   [facility] A DEPT OF [facility]. [facility] AT [address] A DEPT OF THE . [facility] [address] [place] [address] Dept: [phone] Loc: [phone]  Harold Sullivan  10/10/2024  You are scheduled for a Cardiac Catheterization on Wednesday, December 10 with Dr. Lurena Wendel.  1. Please arrive at the [facility] (Main Entrance A) at [facility]: [address] [place], [place] [address] at 8:00 AM (This time is 2 hour(s) before your procedure to ensure your preparation).   Free valet parking service is available. You will check in at ADMITTING. The support person will be asked to wait in the waiting room.  It is OK to have someone drop you off and come back when you are ready to be discharged.    Special note: Every effort is made to have your procedure done on time.  Please understand that emergencies sometimes delay scheduled procedures.  2. Diet: Nothing to eat after midnight.   3. Hydration: You need to be well hydrated before your procedure. On December 10, you may drink approved liquids (see below) until 2 hours before the procedure, with 16 oz of water as your last intake.   List of approved liquids water, clear juice, clear tea, black coffee, fruit juices, non-citric and without pulp, carbonated beverages, Gatorade, Kool -Aid, plain Jello-O and plain ice popsicles.  4. Labs:  CBC, BMP no later than Monday 10/15/24  5. Medication instructions in preparation for your procedure:   Contrast Allergy: No  Do not take Diabetes Med Glucophage (Metformin) on the day of the procedure and HOLD 48 HOURS AFTER THE PROCEDURE.  On the morning of your procedure, take your Aspirin 81 mg and any morning medicines NOT listed above.  You may use sips of water.  6. Plan to go home the same day, you will only stay overnight if medically necessary. 7. Bring a current list of your medications and current insurance cards. 8. You MUST have a responsible person to drive you home. 9. Someone MUST be with you the first 24 hours after you arrive home or your discharge will be delayed. 10. Please wear clothes that are easy to get on and off and wear slip-on shoes.  Thank you for allowing us  to care for you!   -- [facility]

## 2024-10-10 NOTE — Addendum Note (Signed)
 Addended by: Amaranta Mehl L on: 10/10/2024 02:49 PM   Modules accepted: Orders

## 2024-10-10 NOTE — Telephone Encounter (Signed)
Placed order for CBC and BMP.

## 2024-10-10 NOTE — Telephone Encounter (Signed)
 Pt called in for results from stress test. He states it is urgent.

## 2024-10-10 NOTE — Telephone Encounter (Signed)
 Reviewed stress test.  Excellent exercise capacity, no clear chest pain symptoms, but significant abnormalities on EKG and nuclear imaging suggesting possibility of scar as well as part of the heart muscle not getting adequate blood flow.  This is likely due to heart artery narrowings.  I would recommend coronary angiography to rule evaluate heart arteries and consider if stenting is required in case of any severe narrowings.  We could potentially do this in the next week or 2, before patient's travel abroad.  Please discuss the below with the patient: I have reviewed the risks, indications, and alternatives to cardiac catheterization, possible angioplasty, and stenting with the patient. Risks include but are not limited to bleeding, infection, vascular injury, stroke, myocardial infection, arrhythmia, kidney injury, radiation-related injury in the case of prolonged fluoroscopy use, emergency cardiac surgery, and death. The patient understands the risks of serious complication is 1-2 in 1000 with diagnostic cardiac cath and 1-2% or less with angioplasty/stenting.   Thanks MJP

## 2024-10-12 ENCOUNTER — Ambulatory Visit: Payer: Self-pay | Admitting: Cardiology

## 2024-10-12 LAB — CBC
Hematocrit: 40.5 % (ref 37.5–51.0)
Hemoglobin: 13.4 g/dL (ref 13.0–17.7)
MCH: 28 pg (ref 26.6–33.0)
MCHC: 33.1 g/dL (ref 31.5–35.7)
MCV: 85 fL (ref 79–97)
Platelets: 207 x10E3/uL (ref 150–450)
RBC: 4.78 x10E6/uL (ref 4.14–5.80)
RDW: 12.9 % (ref 11.6–15.4)
WBC: 5.4 x10E3/uL (ref 3.4–10.8)

## 2024-10-12 LAB — BASIC METABOLIC PANEL WITH GFR
BUN/Creatinine Ratio: 13 (ref 10–24)
BUN: 17 mg/dL (ref 8–27)
CO2: 21 mmol/L (ref 20–29)
Calcium: 10.3 mg/dL — AB (ref 8.6–10.2)
Chloride: 99 mmol/L (ref 96–106)
Creatinine, Ser: 1.36 mg/dL — AB (ref 0.76–1.27)
Glucose: 155 mg/dL — AB (ref 70–99)
Potassium: 4.7 mmol/L (ref 3.5–5.2)
Sodium: 133 mmol/L — AB (ref 134–144)
eGFR: 59 mL/min/1.73 — AB (ref >59)

## 2024-10-16 ENCOUNTER — Telehealth: Payer: Self-pay | Admitting: *Deleted

## 2024-10-16 NOTE — Telephone Encounter (Signed)
 Cardiac Catheterization scheduled at [facility] for: Wednesday October 17, 2024 10 AM Arrival time [facility] Main Entrance A at: 8 AM  Diet: -Nothing to eat after midnight.  Hydration: -May drink clear liquids until 2 hours before the procedure.  Approved liquids: Water, clear tea, black coffee, fruit juices-non-citric and without pulp,Gatorade, plain Jello/popsicles.   -Please drink 16 oz of water 2 hours before procedure.  Medication instructions: -Hold:  Metformin-day of procedure and 48 hours after procedure  -Other usual morning medications can be taken including aspirin 81 mg.  Plan to go home the same day, you will only stay overnight if medically necessary.  You must have responsible adult to drive you home.  Someone must be with you the first 24 hours after you arrive home.  Reviewed procedure instructions with patient.

## 2024-10-17 ENCOUNTER — Ambulatory Visit (HOSPITAL_COMMUNITY)
Admission: RE | Admit: 2024-10-17 | Discharge: 2024-10-17 | Disposition: A | Attending: Internal Medicine | Admitting: Internal Medicine

## 2024-10-17 ENCOUNTER — Ambulatory Visit: Payer: Self-pay | Admitting: Cardiology

## 2024-10-17 ENCOUNTER — Encounter (HOSPITAL_COMMUNITY): Admission: RE | Disposition: A | Payer: Self-pay | Attending: Internal Medicine

## 2024-10-17 ENCOUNTER — Other Ambulatory Visit: Payer: Self-pay

## 2024-10-17 DIAGNOSIS — Z8249 Family history of ischemic heart disease and other diseases of the circulatory system: Secondary | ICD-10-CM | POA: Insufficient documentation

## 2024-10-17 DIAGNOSIS — E782 Mixed hyperlipidemia: Secondary | ICD-10-CM | POA: Diagnosis not present

## 2024-10-17 DIAGNOSIS — Z79899 Other long term (current) drug therapy: Secondary | ICD-10-CM | POA: Diagnosis not present

## 2024-10-17 DIAGNOSIS — Z7984 Long term (current) use of oral hypoglycemic drugs: Secondary | ICD-10-CM | POA: Diagnosis not present

## 2024-10-17 DIAGNOSIS — I2510 Atherosclerotic heart disease of native coronary artery without angina pectoris: Secondary | ICD-10-CM | POA: Diagnosis not present

## 2024-10-17 DIAGNOSIS — Z789 Other specified health status: Secondary | ICD-10-CM | POA: Insufficient documentation

## 2024-10-17 DIAGNOSIS — Z7982 Long term (current) use of aspirin: Secondary | ICD-10-CM | POA: Diagnosis not present

## 2024-10-17 DIAGNOSIS — I10 Essential (primary) hypertension: Secondary | ICD-10-CM | POA: Diagnosis not present

## 2024-10-17 DIAGNOSIS — E119 Type 2 diabetes mellitus without complications: Secondary | ICD-10-CM | POA: Insufficient documentation

## 2024-10-17 DIAGNOSIS — R9439 Abnormal result of other cardiovascular function study: Secondary | ICD-10-CM | POA: Diagnosis present

## 2024-10-17 HISTORY — PX: LEFT HEART CATH AND CORONARY ANGIOGRAPHY: CATH118249

## 2024-10-17 SURGERY — LEFT HEART CATH AND CORONARY ANGIOGRAPHY
Anesthesia: LOCAL

## 2024-10-17 MED ORDER — FREE WATER: 500.0000 mL | Freq: Once | Status: DC

## 2024-10-17 MED ORDER — ASPIRIN 81 MG PO CHEW: 81.0000 mg | CHEWABLE_TABLET | ORAL | Status: DC

## 2024-10-17 MED ORDER — SODIUM CHLORIDE 0.9% FLUSH: 3.0000 mL | INTRAVENOUS | Status: DC | PRN

## 2024-10-17 MED ORDER — FENTANYL CITRATE (PF) 100 MCG/2ML IJ SOLN
INTRAMUSCULAR | Status: DC | PRN
  Administered 2024-10-17: 25 ug via INTRAVENOUS

## 2024-10-17 MED ORDER — MIDAZOLAM HCL 2 MG/2ML IJ SOLN
INTRAMUSCULAR | Status: AC
  Filled 2024-10-17: qty 2

## 2024-10-17 MED ORDER — FENTANYL CITRATE (PF) 100 MCG/2ML IJ SOLN
INTRAMUSCULAR | Status: AC
  Filled 2024-10-17: qty 2

## 2024-10-17 MED ORDER — SODIUM CHLORIDE 0.9 % IV SOLN: 250.0000 mL | INTRAVENOUS | Status: DC | PRN

## 2024-10-17 MED ORDER — VERAPAMIL HCL 2.5 MG/ML IV SOLN
INTRAVENOUS | Status: DC | PRN
  Administered 2024-10-17: 10 mL via INTRA_ARTERIAL

## 2024-10-17 MED ORDER — HEPARIN (PORCINE) IN NACL 1000-0.9 UT/500ML-% IV SOLN
INTRAVENOUS | Status: DC | PRN
  Administered 2024-10-17: 1000 mL via SURGICAL_CAVITY

## 2024-10-17 MED ORDER — ACETAMINOPHEN 325 MG PO TABS: 650.0000 mg | ORAL_TABLET | ORAL | Status: DC | PRN

## 2024-10-17 MED ORDER — SODIUM CHLORIDE 0.9% FLUSH: 3.0000 mL | Freq: Two times a day (BID) | INTRAVENOUS | Status: DC

## 2024-10-17 MED ORDER — HYDRALAZINE HCL 20 MG/ML IJ SOLN: 10.0000 mg | INTRAMUSCULAR | Status: DC | PRN

## 2024-10-17 MED ORDER — LIDOCAINE HCL (PF) 1 % IJ SOLN
INTRAMUSCULAR | Status: DC | PRN
  Administered 2024-10-17: 5 mL

## 2024-10-17 MED ORDER — IOHEXOL 350 MG/ML SOLN
INTRAVENOUS | Status: DC | PRN
  Administered 2024-10-17: 35 mL via INTRA_ARTERIAL

## 2024-10-17 MED ORDER — ONDANSETRON HCL 4 MG/2ML IJ SOLN: 4.0000 mg | Freq: Four times a day (QID) | INTRAMUSCULAR | Status: DC | PRN

## 2024-10-17 MED ORDER — HEPARIN SODIUM (PORCINE) 1000 UNIT/ML IJ SOLN
INTRAMUSCULAR | Status: AC
  Filled 2024-10-17: qty 10

## 2024-10-17 MED ORDER — HEPARIN SODIUM (PORCINE) 1000 UNIT/ML IJ SOLN
INTRAMUSCULAR | Status: DC | PRN
  Administered 2024-10-17: 5000 [IU] via INTRAVENOUS

## 2024-10-17 MED ORDER — LIDOCAINE HCL (PF) 1 % IJ SOLN
INTRAMUSCULAR | Status: AC
  Filled 2024-10-17: qty 30

## 2024-10-17 MED ORDER — LABETALOL HCL 5 MG/ML IV SOLN: 10.0000 mg | INTRAVENOUS | Status: DC | PRN

## 2024-10-17 MED ORDER — VERAPAMIL HCL 2.5 MG/ML IV SOLN
INTRAVENOUS | Status: AC
  Filled 2024-10-17: qty 2

## 2024-10-17 MED ORDER — MIDAZOLAM HCL (PF) 2 MG/2ML IJ SOLN
INTRAMUSCULAR | Status: DC | PRN
  Administered 2024-10-17: 1 mg via INTRAVENOUS

## 2024-10-17 SURGICAL SUPPLY — 9 items
CATH INFINITI 5FR ANG PIGTAIL (CATHETERS) IMPLANT
CATH INFINITI AMBI 6FR TG (CATHETERS) IMPLANT
DEVICE RAD COMP TR BAND LRG (VASCULAR PRODUCTS) IMPLANT
GLIDESHEATH SLEND SS 6F .021 (SHEATH) IMPLANT
KIT SINGLE USE MANIFOLD (KITS) IMPLANT
KIT SYRINGE INJ CVI SPIKEX1 (MISCELLANEOUS) IMPLANT
PACK CARDIAC CATHETERIZATION (CUSTOM PROCEDURE TRAY) ×1 IMPLANT
SET ATX-X65L (MISCELLANEOUS) IMPLANT
WIRE EMERALD 3MM-J .035X260CM (WIRE) IMPLANT

## 2024-10-17 NOTE — Progress Notes (Signed)
 Patient and patient sister given discharge instructions, education provided no further questions at this time. Patient able to ambulate and void before discharge. Able to tolerate PO intake. Patient site is clean, dry, intact with no hematoma noted upon discharge.

## 2024-10-17 NOTE — Discharge Instructions (Signed)
 Restart metformin 10/19/24

## 2024-10-17 NOTE — Progress Notes (Signed)
 I didn't realize he got scheduled with you today. He was very nervous. I think on good medical therapy, he can now travel abroad with peace of mind.  Thanks MJP

## 2024-10-17 NOTE — Interval H&P Note (Signed)
 History and Physical Interval Note:  10/17/2024 8:42 AM  Harold Sullivan  has presented today for surgery, with the diagnosis of Abnormal Stress Test.  The various methods of treatment have been discussed with the patient and family. After consideration of risks, benefits and other options for treatment, the patient has consented to  Procedure(s): LEFT HEART CATH AND CORONARY ANGIOGRAPHY (N/A) as a surgical intervention.  The patient's history has been reviewed, patient examined, no change in status, stable for surgery.  I have reviewed the patient's chart and labs.  Questions were answered to the patient's satisfaction.     Maki Sweetser K Toure Edmonds

## 2024-10-18 ENCOUNTER — Encounter (HOSPITAL_COMMUNITY): Payer: Self-pay | Admitting: Internal Medicine

## 2024-10-29 ENCOUNTER — Telehealth: Payer: Self-pay | Admitting: Psychiatry

## 2024-10-29 NOTE — Telephone Encounter (Signed)
 Pt is going to [place] and needs 90 days rf of the Clonidine  tablets. He will be gone for 3 months     [facility] [address]

## 2024-10-30 NOTE — Telephone Encounter (Signed)
 Addressed via pharmacy interface

## 2024-11-05 ENCOUNTER — Telehealth: Payer: Self-pay | Admitting: Psychiatry

## 2024-11-05 NOTE — Telephone Encounter (Signed)
 Patient came into office requesting refill for Clonodine 0.3mg . States that he going to [place] on Wednesday and needs a prescription for three months filled. Ph: [phone] appt 2/25 Pharmacy [facility] [address] [place], [place]

## 2024-11-05 NOTE — Telephone Encounter (Signed)
 Rx was sent on 12/23. Patient notified.

## 2025-01-02 ENCOUNTER — Ambulatory Visit: Admitting: Psychiatry

## 2025-01-22 ENCOUNTER — Ambulatory Visit: Admitting: Cardiology

## 2025-02-18 ENCOUNTER — Ambulatory Visit: Admitting: Psychiatry
